# Patient Record
Sex: Female | Born: 1975 | Race: White | Hispanic: No | Marital: Married | Smoking: Never smoker
Health system: Southern US, Community
[De-identification: ages and names within clinical notes are randomized; demographics above are authoritative.]

## PROBLEM LIST (undated history)

## (undated) DIAGNOSIS — J45909 Unspecified asthma, uncomplicated: Secondary | ICD-10-CM

## (undated) DIAGNOSIS — T7840XA Allergy, unspecified, initial encounter: Secondary | ICD-10-CM

## (undated) HISTORY — DX: Allergy, unspecified, initial encounter: T78.40XA

## (undated) HISTORY — DX: Unspecified asthma, uncomplicated: J45.909

---

## 1998-05-07 ENCOUNTER — Emergency Department (HOSPITAL_COMMUNITY): Admission: EM | Admit: 1998-05-07 | Discharge: 1998-05-07 | Payer: Self-pay | Admitting: Emergency Medicine

## 1998-12-12 ENCOUNTER — Other Ambulatory Visit: Admission: RE | Admit: 1998-12-12 | Discharge: 1998-12-12 | Payer: Self-pay | Admitting: Obstetrics and Gynecology

## 2000-01-09 ENCOUNTER — Other Ambulatory Visit: Admission: RE | Admit: 2000-01-09 | Discharge: 2000-01-09 | Payer: Self-pay | Admitting: Obstetrics and Gynecology

## 2001-07-22 ENCOUNTER — Other Ambulatory Visit: Admission: RE | Admit: 2001-07-22 | Discharge: 2001-07-22 | Payer: Self-pay | Admitting: Obstetrics and Gynecology

## 2002-01-27 ENCOUNTER — Ambulatory Visit (HOSPITAL_COMMUNITY): Admission: RE | Admit: 2002-01-27 | Discharge: 2002-01-27 | Payer: Self-pay | Admitting: *Deleted

## 2002-01-27 ENCOUNTER — Encounter: Payer: Self-pay | Admitting: Allergy and Immunology

## 2002-09-25 ENCOUNTER — Other Ambulatory Visit: Admission: RE | Admit: 2002-09-25 | Discharge: 2002-09-25 | Payer: Self-pay | Admitting: Obstetrics and Gynecology

## 2003-09-28 ENCOUNTER — Other Ambulatory Visit: Admission: RE | Admit: 2003-09-28 | Discharge: 2003-09-28 | Payer: Self-pay | Admitting: Obstetrics and Gynecology

## 2004-04-15 ENCOUNTER — Ambulatory Visit: Payer: Self-pay | Admitting: Family Medicine

## 2004-10-31 ENCOUNTER — Ambulatory Visit: Payer: Self-pay | Admitting: Family Medicine

## 2005-01-22 ENCOUNTER — Ambulatory Visit: Payer: Self-pay | Admitting: Internal Medicine

## 2005-02-09 ENCOUNTER — Other Ambulatory Visit: Admission: RE | Admit: 2005-02-09 | Discharge: 2005-02-09 | Payer: Self-pay | Admitting: Obstetrics and Gynecology

## 2005-10-19 ENCOUNTER — Ambulatory Visit: Payer: Self-pay | Admitting: Family Medicine

## 2007-07-26 ENCOUNTER — Emergency Department (HOSPITAL_COMMUNITY): Admission: EM | Admit: 2007-07-26 | Discharge: 2007-07-26 | Payer: Self-pay | Admitting: Emergency Medicine

## 2007-08-15 ENCOUNTER — Emergency Department (HOSPITAL_COMMUNITY): Admission: EM | Admit: 2007-08-15 | Discharge: 2007-08-15 | Payer: Self-pay | Admitting: Emergency Medicine

## 2007-10-16 ENCOUNTER — Emergency Department (HOSPITAL_COMMUNITY): Admission: EM | Admit: 2007-10-16 | Discharge: 2007-10-16 | Payer: Self-pay | Admitting: Family Medicine

## 2007-11-24 ENCOUNTER — Emergency Department (HOSPITAL_COMMUNITY): Admission: EM | Admit: 2007-11-24 | Discharge: 2007-11-24 | Payer: Self-pay | Admitting: Family Medicine

## 2008-01-05 ENCOUNTER — Emergency Department (HOSPITAL_COMMUNITY): Admission: EM | Admit: 2008-01-05 | Discharge: 2008-01-05 | Payer: Self-pay | Admitting: Family Medicine

## 2008-02-22 ENCOUNTER — Emergency Department (HOSPITAL_COMMUNITY): Admission: EM | Admit: 2008-02-22 | Discharge: 2008-02-22 | Payer: Self-pay | Admitting: Family Medicine

## 2008-05-01 ENCOUNTER — Emergency Department (HOSPITAL_COMMUNITY): Admission: EM | Admit: 2008-05-01 | Discharge: 2008-05-01 | Payer: Self-pay | Admitting: Family Medicine

## 2010-03-17 ENCOUNTER — Ambulatory Visit (HOSPITAL_COMMUNITY)
Admission: RE | Admit: 2010-03-17 | Discharge: 2010-03-17 | Payer: Self-pay | Source: Home / Self Care | Admitting: Obstetrics and Gynecology

## 2010-04-22 ENCOUNTER — Encounter (INDEPENDENT_AMBULATORY_CARE_PROVIDER_SITE_OTHER): Payer: Self-pay | Admitting: Obstetrics and Gynecology

## 2010-04-22 ENCOUNTER — Inpatient Hospital Stay (HOSPITAL_COMMUNITY)
Admission: RE | Admit: 2010-04-22 | Discharge: 2010-04-26 | Payer: Self-pay | Source: Home / Self Care | Attending: Obstetrics and Gynecology | Admitting: Obstetrics and Gynecology

## 2010-04-23 ENCOUNTER — Encounter: Payer: Self-pay | Admitting: Cardiology

## 2010-04-26 ENCOUNTER — Encounter
Admission: RE | Admit: 2010-04-26 | Discharge: 2010-05-26 | Payer: Self-pay | Source: Home / Self Care | Attending: Obstetrics and Gynecology | Admitting: Obstetrics and Gynecology

## 2010-05-03 ENCOUNTER — Inpatient Hospital Stay (HOSPITAL_COMMUNITY): Admission: AD | Admit: 2010-05-03 | Payer: Self-pay | Admitting: Obstetrics and Gynecology

## 2010-05-24 ENCOUNTER — Encounter: Payer: Self-pay | Admitting: Obstetrics and Gynecology

## 2010-05-27 ENCOUNTER — Encounter
Admission: RE | Admit: 2010-05-27 | Discharge: 2010-06-03 | Payer: Self-pay | Source: Home / Self Care | Attending: Obstetrics and Gynecology | Admitting: Obstetrics and Gynecology

## 2010-06-27 ENCOUNTER — Encounter (HOSPITAL_COMMUNITY): Payer: Self-pay

## 2010-06-27 ENCOUNTER — Encounter (HOSPITAL_COMMUNITY)
Admission: RE | Admit: 2010-06-27 | Discharge: 2010-06-27 | Disposition: A | Discharge status: Home / Self Care | Payer: Self-pay | Source: Ambulatory Visit | Attending: Obstetrics and Gynecology | Admitting: Obstetrics and Gynecology

## 2010-06-27 ENCOUNTER — Ambulatory Visit (HOSPITAL_COMMUNITY)
Admission: RE | Admit: 2010-06-27 | Discharge: 2010-06-27 | Disposition: A | Discharge status: Home / Self Care | Payer: Self-pay | Source: Ambulatory Visit | Attending: Obstetrics and Gynecology | Admitting: Obstetrics and Gynecology

## 2010-06-27 DIAGNOSIS — O923 Agalactia: Secondary | ICD-10-CM | POA: Insufficient documentation

## 2010-07-14 LAB — CARDIAC PANEL(CRET KIN+CKTOT+MB+TROPI)
CK, MB: 10.2 ng/mL (ref 0.3–4.0)
CK, MB: 10.3 ng/mL (ref 0.3–4.0)
Relative Index: 1.7 (ref 0.0–2.5)
Relative Index: 2.3 (ref 0.0–2.5)
Total CK: 421 U/L — ABNORMAL HIGH (ref 7–177)

## 2010-07-14 LAB — COMPREHENSIVE METABOLIC PANEL
ALT: 16 U/L (ref 0–35)
AST: 28 U/L (ref 0–37)
Alkaline Phosphatase: 152 U/L — ABNORMAL HIGH (ref 39–117)
CO2: 23 mEq/L (ref 19–32)
Calcium: 8.5 mg/dL (ref 8.4–10.5)
Chloride: 106 mEq/L (ref 96–112)
GFR calc Af Amer: >60 mL/min (ref >60)
GFR calc non Af Amer: >60 mL/min (ref >60)
Potassium: 3.8 mEq/L (ref 3.5–5.1)
Sodium: 135 mEq/L (ref 135–145)
Total Bilirubin: 0.2 mg/dL — ABNORMAL LOW (ref 0.3–1.2)

## 2010-07-14 LAB — CBC
HCT: 36.9 % (ref 36.0–46.0)
Hemoglobin: 10.3 g/dL — ABNORMAL LOW (ref 12.0–15.0)
Hemoglobin: 12.6 g/dL (ref 12.0–15.0)
MCHC: 33.7 g/dL (ref 30.0–36.0)
RBC: 4.21 MIL/uL (ref 3.87–5.11)
WBC: 10.5 10*3/uL (ref 4.0–10.5)

## 2010-07-14 LAB — DIFFERENTIAL
Basophils Absolute: 0 10*3/uL (ref 0.0–0.1)
Basophils Relative: 0 % (ref 0–1)
Monocytes Relative: 10 % (ref 3–12)
Neutro Abs: 8 10*3/uL — ABNORMAL HIGH (ref 1.7–7.7)
Neutrophils Relative %: 76 % (ref 43–77)

## 2010-07-14 LAB — GLUCOSE, CAPILLARY

## 2010-07-15 LAB — SURGICAL PCR SCREEN: MRSA, PCR: NEGATIVE

## 2010-07-15 LAB — CBC
HCT: 38.4 % (ref 36.0–46.0)
MCHC: 33.6 g/dL (ref 30.0–36.0)
MCV: 93.7 fL (ref 78.0–100.0)
RDW: 13.4 % (ref 11.5–15.5)

## 2012-06-10 ENCOUNTER — Emergency Department (HOSPITAL_COMMUNITY)
Admission: EM | Admit: 2012-06-10 | Discharge: 2012-06-10 | Disposition: A | Discharge status: Home / Self Care | Payer: 59 | Source: Home / Self Care | Attending: Family Medicine | Admitting: Family Medicine

## 2012-06-10 ENCOUNTER — Encounter (HOSPITAL_COMMUNITY): Payer: Self-pay

## 2012-06-10 DIAGNOSIS — J329 Chronic sinusitis, unspecified: Secondary | ICD-10-CM

## 2012-06-10 DIAGNOSIS — J40 Bronchitis, not specified as acute or chronic: Secondary | ICD-10-CM

## 2012-06-10 MED ORDER — ALBUTEROL SULFATE HFA 108 (90 BASE) MCG/ACT IN AERS: 1.0000 | INHALATION_SPRAY | Freq: Four times a day (QID) | RESPIRATORY_TRACT | Status: DC | PRN

## 2012-06-10 MED ORDER — CETIRIZINE-PSEUDOEPHEDRINE ER 5-120 MG PO TB12: 1.0000 | ORAL_TABLET | Freq: Two times a day (BID) | ORAL | Status: DC

## 2012-06-10 MED ORDER — AZITHROMYCIN 250 MG PO TABS: ORAL_TABLET | ORAL | Status: DC

## 2012-06-10 MED ORDER — PREDNISONE 20 MG PO TABS: ORAL_TABLET | ORAL | Status: DC

## 2012-06-10 MED ORDER — HYDROCODONE-ACETAMINOPHEN 7.5-325 MG/15ML PO SOLN: 15.0000 mL | Freq: Four times a day (QID) | ORAL | Status: DC | PRN

## 2012-06-10 MED ORDER — BENZONATATE 100 MG PO CAPS: 100.0000 mg | ORAL_CAPSULE | Freq: Three times a day (TID) | ORAL | Status: DC

## 2012-06-10 NOTE — ED Notes (Signed)
I THINK I HAVE A SINUS INFECTION

## 2012-06-11 NOTE — ED Provider Notes (Signed)
History     CSN: [id number]  Arrival date & time 06/10/12  1553   First MD Initiated Contact with Patient 06/10/12 1600      Chief Complaint  Patient presents with  . Facial Pain    (Consider location/radiation/quality/duration/timing/severity/associated sxs/prior treatment) HPI Comments: 37 y/o non smoker female here c/o nasal congestion post nasal drip and rhinorrhea for more than 2 weeks. Symptoms associated with throat irritation and bilateral ear pain. Has had cough spells with wheezing at night time associated with clear sputum. Denies shortness of breath or chest pain. Denies fever or chills. Although temp here is 100.1. Appetite stable.    History reviewed. No pertinent past medical history.  History reviewed. No pertinent past surgical history.  History reviewed. No pertinent family history.  History  Substance Use Topics  . Smoking status: Not on file  . Smokeless tobacco: Not on file  . Alcohol Use: Not on file    OB History   Grav Para Term Preterm Abortions TAB SAB Ect Mult Living                  Review of Systems  Constitutional: Negative for fever, chills and appetite change.  HENT: Positive for ear pain, congestion, sore throat, rhinorrhea, postnasal drip and sinus pressure.   Eyes: Negative for discharge.  Respiratory: Positive for cough and wheezing. Negative for shortness of breath.   Cardiovascular: Negative for chest pain.  Gastrointestinal: Negative for nausea, vomiting, abdominal pain and diarrhea.  Skin: Negative for rash.  All other systems reviewed and are negative.    Allergies  Amoxicillin  Home Medications   Current Outpatient Rx  Name  Route  Sig  Dispense  Refill  . albuterol (PROVENTIL HFA;VENTOLIN HFA) 108 (90 BASE) MCG/ACT inhaler   Inhalation   Inhale 1-2 puffs into the lungs every 6 (six) hours as needed for wheezing.   1 Inhaler   0   . azithromycin (ZITHROMAX) 250 MG tablet      2 tabs by mouth on day one then one  tab daily for 4 more days.   6 tablet   0   . benzonatate (TESSALON) 100 MG capsule   Oral   Take 1 capsule (100 mg total) by mouth every 8 (eight) hours.   21 capsule   0   . cetirizine-pseudoephedrine (ZYRTEC-D) 5-120 MG per tablet   Oral   Take 1 tablet by mouth 2 (two) times daily.   30 tablet   0   . HYDROcodone-acetaminophen (HYCET) 7.5-325 mg/15 ml solution   Oral   Take 15 mLs by mouth 4 (four) times daily as needed for pain or cough.   120 mL   0   . predniSONE (DELTASONE) 20 MG tablet      2 tabs by mouth daily for 5 days   10 tablet   0     BP 118/75  Pulse 84  Temp(Src) 100.1 F (37.8 C) (Oral)  Resp 18  SpO2 100%  Physical Exam  Vitals reviewed. Constitutional: She is oriented to person, place, and time. She appears well-developed and well-nourished. No distress.  HENT:  Head: Normocephalic and atraumatic.  Nasal Congestion with erythema and swelling of nasal turbinates, clear rhinorrhea. Significant pharyngeal erythema no exudates. No uvula deviation. No trismus. TM's with increased vascular markings and some dullness bilaterally no swelling or bulging  Eyes: Conjunctivae and EOM are normal. Pupils are equal, round, and reactive to light. Right eye exhibits no discharge. Left eye  exhibits no discharge.  Neck: Neck supple.  Cardiovascular: Normal rate, regular rhythm and normal heart sounds.   Pulmonary/Chest: Breath sounds normal. No respiratory distress. She has no wheezes. She has no rales. She exhibits no tenderness.  Persistent bronchitic cough  Lymphadenopathy:    She has no cervical adenopathy.  Neurological: She is alert and oriented to person, place, and time.  Skin: She is not diaphoretic.    ED Course  Procedures (including critical care time)  Labs Reviewed  POCT RAPID STREP A ([facility] ONLY)   No results found.   1. Sinusitis   2. Bronchitis       MDM   Negative rapid strep test. Treated with prednisone, albuterol,  tessalon perls,  Hydrocodone/acetaminophen, cetirizine/pseudoephedrine and azithromycin.  Supportive care and red flags that should prompt her return to medical attention discussed with patient and provided in writing.        Sharin Grave, MD 06/11/12 1023

## 2013-11-09 ENCOUNTER — Ambulatory Visit (INDEPENDENT_AMBULATORY_CARE_PROVIDER_SITE_OTHER): Payer: 59 | Admitting: Physician Assistant

## 2013-11-09 VITALS — BP 120/80 | HR 82 | Temp 98.8°F | Resp 18 | Ht 64.5 in | Wt 160.0 lb

## 2013-11-09 DIAGNOSIS — R509 Fever, unspecified: Secondary | ICD-10-CM

## 2013-11-09 DIAGNOSIS — R52 Pain, unspecified: Secondary | ICD-10-CM

## 2013-11-09 DIAGNOSIS — W57XXXA Bitten or stung by nonvenomous insect and other nonvenomous arthropods, initial encounter: Secondary | ICD-10-CM

## 2013-11-09 MED ORDER — DOXYCYCLINE HYCLATE 100 MG PO CAPS: 100.0000 mg | ORAL_CAPSULE | Freq: Two times a day (BID) | ORAL | Status: DC

## 2013-11-09 NOTE — Progress Notes (Signed)
Subjective:    Patient ID: Mallory SarasKathryn C Williams, female    DOB: 1975/08/28, 38 y.o.   MRN: [id number]  HPI 38 year old female presents for evaluation of of 4 day history of fever, chills, body aches, and headache. States symptoms have been persistent and have not improved.  Temp max is 101.8. Admits to a tick bite on July 4th on her right upper arm.  Is sure that it was not on her for >24 hours. She does have a dog that is likely the source of the tick.  States she subsequently cleaned her entire house and now has chest/back soreness, but no cough, SOB, wheezing, dizziness, N/V, or abdominal pain. States she has been taking tylenol and motrin which doe help with the headache and body aches.   Her husband did notice a place on her back this morning that he thought looked like a bruise.  It is on the left side of her back. No pain or pruritis noted.  Denies cough, sore throat, otalgia, or other URI symptoms. Headache is located across her forehead.   Patient is otherwise healthy with no other concerns today Has 993 1/36 year old triplets at home.    Review of Systems  Constitutional: Positive for fever and chills.  HENT: Positive for sinus pressure (frontal headache). Negative for congestion, postnasal drip, rhinorrhea and sore throat.   Respiratory: Negative for cough, chest tightness, shortness of breath and wheezing.   Gastrointestinal: Negative for nausea, vomiting and abdominal pain.  Skin: Positive for rash.  Neurological: Positive for headaches. Negative for dizziness.       Objective:   Physical Exam  Constitutional: She is oriented to person, place, and time. She appears well-developed and well-nourished.  HENT:  Head: Normocephalic and atraumatic.  Right Ear: Hearing, tympanic membrane, external ear and ear canal normal.  Left Ear: Hearing, tympanic membrane, external ear and ear canal normal.  Mouth/Throat: Uvula is midline, oropharynx is clear and moist and mucous membranes are normal.  No oropharyngeal exudate.  Eyes: Conjunctivae and EOM are normal. Pupils are equal, round, and reactive to light.  Neck: Normal range of motion. Neck supple.  Cardiovascular: Normal rate, regular rhythm and normal heart sounds.   Pulmonary/Chest: Effort normal.  Abdominal: Soft. Bowel sounds are normal. There is no tenderness. There is no rebound and no guarding.  Lymphadenopathy:    She has no cervical adenopathy.  Neurological: She is alert and oriented to person, place, and time.  Skin:     Noted area has a group of erythematous papules. Not TTP. No induration, drainage, vesicles, blisters, or warmth noted.   Psychiatric: She has a normal mood and affect. Her behavior is normal. Judgment and thought content normal.          Assessment & Plan:  Fever, unspecified - Plan: CBC with Differential, [place] spotted fvr ab, IgM-blood, Comprehensive metabolic panel, doxycycline (VIBRAMYCIN) 100 MG capsule  Body aches - Plan: CBC with Differential, [place] spotted fvr ab, IgM-blood, Comprehensive metabolic panel, doxycycline (VIBRAMYCIN) 100 MG capsule  Tick bite - Plan: CBC with Differential, [place] spotted fvr ab, IgM-blood, Comprehensive metabolic panel, doxycycline (VIBRAMYCIN) 100 MG capsule  Will cover for possible tick disease based on symptoms. RMSF titer pending along with CBC and CMET. Start doxycycline 100 mg bid x 14 days - cautioned against photosensitivity.  Watchful waiting on rash - ? Shingles - will send rx for valtrex if blisters/vesicles, pain, or spreading anteriorly.  RTC precautions discussed.  F/u if symptoms worsening or fail to improve. Discussed need to trend RMSF titer if negative and sx's fail to improve. Continue Motrin or Tylenol for headache, fever, and body aches.

## 2013-11-10 ENCOUNTER — Telehealth: Payer: Self-pay

## 2013-11-10 LAB — COMPREHENSIVE METABOLIC PANEL
ALT: 11 U/L (ref 0–35)
AST: 12 U/L (ref 0–37)
Albumin: 4.8 g/dL (ref 3.5–5.2)
Alkaline Phosphatase: 51 U/L (ref 39–117)
BUN: 13 mg/dL (ref 6–23)
CO2: 24 mEq/L (ref 19–32)
Calcium: 9.7 mg/dL (ref 8.4–10.5)
Chloride: 106 mEq/L (ref 96–112)
Creat: 0.73 mg/dL (ref 0.50–1.10)
Glucose, Bld: 95 mg/dL (ref 70–99)
Potassium: 4.1 mEq/L (ref 3.5–5.3)
Sodium: 138 mEq/L (ref 135–145)
Total Bilirubin: 0.4 mg/dL (ref 0.2–1.2)
Total Protein: 7.1 g/dL (ref 6.0–8.3)

## 2013-11-10 LAB — CBC WITH DIFFERENTIAL/PLATELET
Basophils Absolute: 0 10*3/uL (ref 0.0–0.1)
Basophils Relative: 0 % (ref 0–1)
Eosinophils Absolute: 0 10*3/uL (ref 0.0–0.7)
Eosinophils Relative: 0 % (ref 0–5)
HCT: 40.3 % (ref 36.0–46.0)
Hemoglobin: 13.8 g/dL (ref 12.0–15.0)
Lymphocytes Relative: 20 % (ref 12–46)
Lymphs Abs: 1.4 10*3/uL (ref 0.7–4.0)
MCH: 29.2 pg (ref 26.0–34.0)
MCHC: 34.2 g/dL (ref 30.0–36.0)
MCV: 85.2 fL (ref 78.0–100.0)
Monocytes Absolute: 0.8 10*3/uL (ref 0.1–1.0)
Monocytes Relative: 11 % (ref 3–12)
Neutro Abs: 4.9 10*3/uL (ref 1.7–7.7)
Neutrophils Relative %: 69 % (ref 43–77)
Platelets: 311 10*3/uL (ref 150–400)
RBC: 4.73 MIL/uL (ref 3.87–5.11)
RDW: 12.9 % (ref 11.5–15.5)
WBC: 7.1 10*3/uL (ref 4.0–10.5)

## 2013-11-10 LAB — ROCKY MTN SPOTTED FVR AB, IGM-BLOOD: ROCKY MTN SPOTTED FEVER, IGM: 0.3 IV

## 2013-11-10 MED ORDER — VALACYCLOVIR HCL 1 G PO TABS: 1000.0000 mg | ORAL_TABLET | Freq: Two times a day (BID) | ORAL | Status: DC

## 2013-11-10 NOTE — Telephone Encounter (Signed)
Patient states she saw Herbert SetaHeather yesterday 7/9 for a rash and if her symptoms got worse she was told Herbert SetaHeather would put in a prescription for her. Please advise at [phone]. [place] would [place] the pharmacy in [place] [facility]

## 2013-11-10 NOTE — Telephone Encounter (Signed)
Spoke with pt. She says the initial rash is very tender to touch and it is now spreading under her breast and on her chest. It has not crossed the midline. Spoke with Avery Dennison[facility]ather. Ok to send in Valtrex. Sent

## 2013-11-10 NOTE — Telephone Encounter (Signed)
Left message on machine to call back  

## 2014-05-15 ENCOUNTER — Ambulatory Visit (INDEPENDENT_AMBULATORY_CARE_PROVIDER_SITE_OTHER): Payer: 59 | Admitting: Physician Assistant

## 2014-05-15 VITALS — BP 120/72 | HR 88 | Temp 98.6°F | Resp 18 | Ht 65.0 in | Wt 161.0 lb

## 2014-05-15 DIAGNOSIS — B9789 Other viral agents as the cause of diseases classified elsewhere: Secondary | ICD-10-CM

## 2014-05-15 DIAGNOSIS — J45990 Exercise induced bronchospasm: Secondary | ICD-10-CM

## 2014-05-15 DIAGNOSIS — J069 Acute upper respiratory infection, unspecified: Secondary | ICD-10-CM

## 2014-05-15 DIAGNOSIS — J029 Acute pharyngitis, unspecified: Secondary | ICD-10-CM

## 2014-05-15 LAB — POCT RAPID STREP A (OFFICE): RAPID STREP A SCREEN: NEGATIVE

## 2014-05-15 MED ORDER — BENZONATATE 100 MG PO CAPS: 100.0000 mg | ORAL_CAPSULE | Freq: Three times a day (TID) | ORAL | Status: DC | PRN

## 2014-05-15 MED ORDER — ALBUTEROL SULFATE HFA 108 (90 BASE) MCG/ACT IN AERS: 1.0000 | INHALATION_SPRAY | Freq: Four times a day (QID) | RESPIRATORY_TRACT | Status: DC | PRN

## 2014-05-15 MED ORDER — HYDROCODONE-HOMATROPINE 5-1.5 MG/5ML PO SYRP: 5.0000 mL | ORAL_SOLUTION | Freq: Three times a day (TID) | ORAL | Status: DC | PRN

## 2014-05-15 MED ORDER — LORATADINE-PSEUDOEPHEDRINE ER 5-120 MG PO TB12: 1.0000 | ORAL_TABLET | Freq: Two times a day (BID) | ORAL | Status: DC

## 2014-05-15 NOTE — Progress Notes (Signed)
IDENTIFYING INFORMATION  Matthew SarasKathryn C Wolden / DOB: 08-07-1975 / MRN: [id number]  The patient  does not have a problem list on file.  SUBJECTIVE  CC: Cough; Fever; Nasal Congestion; Chills; and Headache   HPI: Ms. Mallory Williams is a 39 y.o. y.o. female presenting for cold like symptoms that started 5 days ago.  She reports this started with coughing.  4 days ago she notes having sinus congestion and pressure, and reports fevers for the last three nights (Tmax:101.4).  She has tried Nyquil, Dayquil, Robitusin, Cough Drops, and Tylenol, all which gave her mild relief.    She has a history of exercise induced asthma.  Her cough is worse at night.     She  has no past medical history on file.    Shecurrently has no medications in their medication list.  Ms. Mallory Williams is allergic to amoxicillin. She  reports that she has never smoked. She does not have any smokeless tobacco history on file. She reports that she drinks alcohol. She reports that she does not use illicit drugs. She  has no sexual activity history on file.  The patient  has past surgical history that includes Cesarean section.  Her family history is not on file.  Review of Systems  Constitutional: Positive for fever, chills and diaphoresis. Negative for weight loss.  Respiratory: Positive for cough, sputum production and shortness of breath. Negative for hemoptysis and wheezing.   Cardiovascular: Negative for chest pain, palpitations, orthopnea and leg swelling.  Gastrointestinal: Negative for nausea, vomiting, abdominal pain, diarrhea and constipation.  Genitourinary: Negative.   Skin: Negative.     OBJECTIVE  Blood pressure 120/72, pulse 88, temperature 98.6 F (37 C), temperature source Oral, resp. rate 18, height 5\' 5"  (1.651 m), weight 161 lb (73.029 kg), SpO2 97 %. The patient's body mass index is 26.79 kg/(m^2).  Physical Exam  Constitutional: She is oriented to person, place, and time. She appears well-developed and  well-nourished. No distress.  HENT:  Right Ear: Hearing and tympanic membrane normal.  Left Ear: Hearing and tympanic membrane normal.  Nose: Mucosal edema present. Right sinus exhibits no maxillary sinus tenderness and no frontal sinus tenderness. Left sinus exhibits no maxillary sinus tenderness and no frontal sinus tenderness.  Mouth/Throat: Uvula is midline and mucous membranes are normal. No uvula swelling. Posterior oropharyngeal erythema present. No oropharyngeal exudate, posterior oropharyngeal edema or tonsillar abscesses.  Cardiovascular: Normal rate, regular rhythm and normal heart sounds.  Exam reveals no gallop and no friction rub.   No murmur heard. Respiratory: Effort normal and breath sounds normal. She has no wheezes.  GI: Soft. Bowel sounds are normal. She exhibits no distension. There is no tenderness.  Neurological: She is alert and oriented to person, place, and time. She has normal reflexes.  Skin: Skin is warm and dry. She is not diaphoretic.  Psychiatric: She has a normal mood and affect. Her behavior is normal. Judgment and thought content normal.    Results for orders placed or performed in visit on 05/15/14 (from the past 24 hour(s))  POCT rapid strep A     Status: None   Collection Time: 05/15/14  1:39 PM  Result Value Ref Range   Rapid Strep A Screen Negative Negative    ASSESSMENT & PLAN  [name] was seen today for cough, fever, nasal congestion, chills and headache.  Diagnoses and associated orders for this visit:  Sore throat - POCT rapid strep A - Culture, Group A Strep   Viral  URI with cough - HYDROcodone-homatropine (HYCODAN) 5-1.5 MG/5ML syrup; Take 5 mLs by mouth every 8 (eight) hours as needed for cough. - benzonatate (TESSALON) 100 MG capsule; Take 1-2 capsules (100-200 mg total) by mouth 3 (three) times daily as needed for cough. - loratadine-pseudoephedrine (CLARITIN-D 12 HOUR) 5-120 MG per tablet; Take 1 tablet by mouth 2 (two) times  daily.  Asthma, exercise induced: Historical diagnosis - albuterol (PROVENTIL HFA;VENTOLIN HFA) 108 (90 BASE) MCG/ACT inhaler; Inhale 1-2 puffs into the lungs every 6 (six) hours as needed for wheezing.     The patient was instructed to to call or comeback to clinic as needed, or should symptoms warrant.  Deliah Boston, MHS, PA-C [facility] 05/15/2014 1:52 PM

## 2014-05-17 LAB — CULTURE, GROUP A STREP: Organism ID, Bacteria: NORMAL

## 2014-06-03 ENCOUNTER — Ambulatory Visit (INDEPENDENT_AMBULATORY_CARE_PROVIDER_SITE_OTHER): Payer: 59 | Admitting: Internal Medicine

## 2014-06-03 VITALS — BP 114/70 | HR 94 | Temp 98.6°F | Resp 16 | Ht 64.5 in | Wt 159.5 lb

## 2014-06-03 DIAGNOSIS — J069 Acute upper respiratory infection, unspecified: Secondary | ICD-10-CM

## 2014-06-03 DIAGNOSIS — J0100 Acute maxillary sinusitis, unspecified: Secondary | ICD-10-CM

## 2014-06-03 DIAGNOSIS — B9789 Other viral agents as the cause of diseases classified elsewhere: Secondary | ICD-10-CM

## 2014-06-03 MED ORDER — CEFDINIR 300 MG PO CAPS: 300.0000 mg | ORAL_CAPSULE | Freq: Two times a day (BID) | ORAL | Status: DC

## 2014-06-03 MED ORDER — HYDROCODONE-HOMATROPINE 5-1.5 MG/5ML PO SYRP: 5.0000 mL | ORAL_SOLUTION | Freq: Four times a day (QID) | ORAL | Status: DC | PRN

## 2014-06-03 NOTE — Progress Notes (Signed)
   Subjective:  This chart was scribed for Mallory Siaobert Ammy Lienhard, MD by Haywood PaoNadim Abu Williams, ED Scribe at [facility].The patient was seen in exam room 02 and the patient's care was started at 1:39 PM.   Patient ID: Mallory Williams, female    DOB: 1976-01-23, 39 y.o.   MRN: [id number] Chief Complaint  Patient presents with  . Follow-up    from 05/15/2014 visit-Symptoms have not gotten any better  . Headache  . Cough  . Nasal Congestion  . Fatigue   HPI  HPI Comments: Mallory Williams is a 39 y.o. female who [facility] complaining of nasal congestion which has began after her last visit on 05/15/2014. Pt reports that her symptoms from her last visit have improved but shortly after she developed a sinus infection. She has HA, cough and rhinorrhea as associated symptoms. Laying down and activity worsens her symptoms. She has used her inhaler which has provided relief. Pt has also used a hydrocodone syrup which has also provided relief.   Pt works [employer]   Review of Systems  HENT: Positive for congestion and rhinorrhea.   Respiratory: Positive for cough.   Neurological: Positive for headaches.  Psychiatric/Behavioral: Positive for sleep disturbance.      Objective:  BP 114/70 mmHg  Pulse 94  Temp(Src) 98.6 F (37 C) (Oral)  Resp 16  Ht 5' 4.5" (1.638 m)  Wt 159 lb 8 oz (72.349 kg)  BMI 26.97 kg/m2  SpO2 98%  Physical Exam  Constitutional: She is oriented to person, place, and time. She appears well-developed and well-nourished. No distress.  HENT:  Head: Normocephalic and atraumatic.  Right Ear: External ear normal.  Left Ear: External ear normal.  Mouth/Throat: Oropharynx is clear and moist.  Swollen turbinates with purulent discharge.  Eyes: Conjunctivae are normal. Pupils are equal, round, and reactive to light.  Neck: Normal range of motion.  Cardiovascular: Normal rate and regular rhythm.   Pulmonary/Chest: Effort normal and breath sounds normal. No  respiratory distress. She has no wheezes.  Musculoskeletal: Normal range of motion.  Lymphadenopathy:    She has no cervical adenopathy.  Neurological: She is alert and oriented to person, place, and time.  Skin: Skin is warm and dry.  Psychiatric: She has a normal mood and affect. Her behavior is normal.  Nursing note and vitals reviewed.      Assessment & Plan:  Acute maxillary sinusitis, recurrence not specified  Cough - Plan: HYDROcodone-homatropine (HYCODAN) 5-1.5 MG/5ML syrup  Meds ordered this encounter  Medications  . cefdinir (OMNICEF) 300 MG capsule    Sig: Take 1 capsule (300 mg total) by mouth 2 (two) times daily.    Dispense:  20 capsule    Refill:  0  . HYDROcodone-homatropine (HYCODAN) 5-1.5 MG/5ML syrup    Sig: Take 5 mLs by mouth every 6 (six) hours as needed for cough.    Dispense:  120 mL    Refill:  0   F/u 10d if not well    I have completed the patient encounter in its entirety as documented by the scribe, with editing by me where necessary. Mallory Williams, M.D.

## 2015-02-12 ENCOUNTER — Ambulatory Visit (INDEPENDENT_AMBULATORY_CARE_PROVIDER_SITE_OTHER): Payer: 59

## 2015-02-12 ENCOUNTER — Ambulatory Visit (INDEPENDENT_AMBULATORY_CARE_PROVIDER_SITE_OTHER): Payer: 59 | Admitting: Emergency Medicine

## 2015-02-12 VITALS — BP 108/78 | HR 77 | Temp 98.4°F | Resp 16 | Ht 64.5 in | Wt 162.0 lb

## 2015-02-12 DIAGNOSIS — R05 Cough: Secondary | ICD-10-CM | POA: Diagnosis not present

## 2015-02-12 DIAGNOSIS — J988 Other specified respiratory disorders: Secondary | ICD-10-CM | POA: Diagnosis not present

## 2015-02-12 DIAGNOSIS — J018 Other acute sinusitis: Secondary | ICD-10-CM | POA: Diagnosis not present

## 2015-02-12 DIAGNOSIS — R058 Other specified cough: Secondary | ICD-10-CM

## 2015-02-12 MED ORDER — LEVOFLOXACIN 500 MG PO TABS: 500.0000 mg | ORAL_TABLET | Freq: Every day | ORAL | Status: AC

## 2015-02-12 MED ORDER — GUAIFENESIN ER 1200 MG PO TB12: 1.0000 | ORAL_TABLET | Freq: Two times a day (BID) | ORAL | Status: DC | PRN

## 2015-02-12 NOTE — Progress Notes (Signed)
  Medical screening examination/treatment/procedure(s) were performed by non-physician practitioner and as supervising physician I was immediately available for consultation/collaboration.     

## 2015-02-12 NOTE — Patient Instructions (Addendum)
Please use the nasal saline irrigation over the counter. Please take the medications as prescribed.    Sinusitis, Adult Sinusitis is redness, soreness, and inflammation of the paranasal sinuses. Paranasal sinuses are air pockets within the bones of your face. They are located beneath your eyes, in the middle of your forehead, and above your eyes. In healthy paranasal sinuses, mucus is able to drain out, and air is able to circulate through them by way of your nose. However, when your paranasal sinuses are inflamed, mucus and air can become trapped. This can allow bacteria and other germs to grow and cause infection. Sinusitis can develop quickly and last only a short time (acute) or continue over a long period (chronic). Sinusitis that lasts for more than 12 weeks is considered chronic. CAUSES Causes of sinusitis include:  Allergies.  Structural abnormalities, such as displacement of the cartilage that separates your nostrils (deviated septum), which can decrease the air flow through your nose and sinuses and affect sinus drainage.  Functional abnormalities, such as when the small hairs (cilia) that line your sinuses and help remove mucus do not work properly or are not present. SIGNS AND SYMPTOMS Symptoms of acute and chronic sinusitis are the same. The primary symptoms are pain and pressure around the affected sinuses. Other symptoms include:  Upper toothache.  Earache.  Headache.  Bad breath.  Decreased sense of smell and taste.  A cough, which worsens when you are lying flat.  Fatigue.  Fever.  Thick drainage from your nose, which often is green and may contain pus (purulent).  Swelling and warmth over the affected sinuses. DIAGNOSIS Your health care provider will perform a physical exam. During your exam, your health care provider may perform any of the following to help determine if you have acute sinusitis or chronic sinusitis:  Look in your nose for signs of abnormal  growths in your nostrils (nasal polyps).  Tap over the affected sinus to check for signs of infection.  View the inside of your sinuses using an imaging device that has a light attached (endoscope). If your health care provider suspects that you have chronic sinusitis, one or more of the following tests may be recommended:  Allergy tests.  Nasal culture. A sample of mucus is taken from your nose, sent to a lab, and screened for bacteria.  Nasal cytology. A sample of mucus is taken from your nose and examined by your health care provider to determine if your sinusitis is related to an allergy. TREATMENT Most cases of acute sinusitis are related to a viral infection and will resolve on their own within 10 days. Sometimes, medicines are prescribed to help relieve symptoms of both acute and chronic sinusitis. These may include pain medicines, decongestants, nasal steroid sprays, or saline sprays. However, for sinusitis related to a bacterial infection, your health care provider will prescribe antibiotic medicines. These are medicines that will help kill the bacteria causing the infection. Rarely, sinusitis is caused by a fungal infection. In these cases, your health care provider will prescribe antifungal medicine. For some cases of chronic sinusitis, surgery is needed. Generally, these are cases in which sinusitis recurs more than 3 times per year, despite other treatments. HOME CARE INSTRUCTIONS  Drink plenty of water. Water helps thin the mucus so your sinuses can drain more easily.  Use a humidifier.  Inhale steam 3-4 times a day (for example, sit in the bathroom with the shower running).  Apply a warm, moist washcloth to your face 3-4  times a day, or as directed by your health care provider.  Use saline nasal sprays to help moisten and clean your sinuses.  Take medicines only as directed by your health care provider.  If you were prescribed either an antibiotic or antifungal medicine,  finish it all even if you start to feel better. SEEK IMMEDIATE MEDICAL CARE IF:  You have increasing pain or severe headaches.  You have nausea, vomiting, or drowsiness.  You have swelling around your face.  You have vision problems.  You have a stiff neck.  You have difficulty breathing.   This information is not intended to replace advice given to you by your health care provider. Make sure you discuss any questions you have with your health care provider.   Document Released: 04/20/2005 Document Revised: 05/11/2014 Document Reviewed: 05/05/2011 Elsevier Interactive Patient Education [employer].

## 2015-02-12 NOTE — Progress Notes (Signed)
[facility] [address] [place] [address] [phone]  Date:  02/12/2015   Name:  Mallory Williams   DOB:  [birth year]   MRN:  [id number]  PCP:  No PCP Per Patient    History of Present Illness:  Mallory Williams is a 39 y.o. female patient who presents to [facility] for chief complaint of fever, body aches, chills and fever that started about 1 week ago.  Fever resolved within 2 days, but she continues to have nasal pressure along her forehead and near ears.  There is thick mucus.  There is sore throat and post-nasal drip  There is no dizziness, productive cough of grey greenish sputum.  There is dyspnea with coughing fits.  She has used tylenol cold and flu, and day/ny-quil.  Sick contacts at home with bacterial respiratory infection, ear infection, and bronchitis.  She also works as a Adult nurse. She is a non-smoker.    There are no active problems to display for this patient.   No past medical history on file.  Past Surgical History  Procedure Laterality Date  . Cesarean section      Social History  Substance Use Topics  . Smoking status: Never Smoker   . Smokeless tobacco: Never Used  . Alcohol Use: 0.0 oz/week    0 Standard drinks or equivalent per week    Family History  Problem Relation Age of Onset  . Breast cancer Maternal Grandmother   . Stroke Maternal Grandmother   . Heart attack Maternal Grandfather     Allergies  Allergen Reactions  . Amoxicillin     Medication list has been reviewed and updated.  Current Outpatient Prescriptions on File Prior to Visit  Medication Sig Dispense Refill  . albuterol (PROVENTIL HFA;VENTOLIN HFA) 108 (90 BASE) MCG/ACT inhaler Inhale 1-2 puffs into the lungs every 6 (six) hours as needed for wheezing. (Patient not taking: Reported on 02/12/2015) 1 Inhaler 0   No current facility-administered medications on file prior to visit.    ROS ROS otherwise unremarkable unless listed above.    Physical  Examination: BP 108/78 mmHg  Pulse 77  Temp(Src) 98.4 F (36.9 C) (Oral)  Resp 16  Ht 5' 4.5" (1.638 m)  Wt 162 lb (73.483 kg)  BMI 27.39 kg/m2  SpO2 98% Ideal Body Weight: Weight in (lb) to have BMI = 25: 147.6  Physical Exam  Constitutional: She is oriented to person, place, and time. She appears well-developed and well-nourished. No distress.  HENT:  Head: Normocephalic and atraumatic.  Right Ear: Tympanic membrane, external ear and ear canal normal.  Left Ear: Tympanic membrane, external ear and ear canal normal.  Nose: Mucosal edema and rhinorrhea (thickened white mucus) present. Right sinus exhibits no maxillary sinus tenderness and no frontal sinus tenderness. Left sinus exhibits no maxillary sinus tenderness and no frontal sinus tenderness.  Mouth/Throat: Posterior oropharyngeal edema and posterior oropharyngeal erythema present. No oropharyngeal exudate.  Eyes: Conjunctivae and EOM are normal. Pupils are equal, round, and reactive to light.  Cardiovascular: Normal rate.   Pulmonary/Chest: Effort normal. No respiratory distress. She has no decreased breath sounds. She has no wheezes. She has no rhonchi.  Neurological: She is alert and oriented to person, place, and time.  Skin: She is not diaphoretic.  Psychiatric: She has a normal mood and affect. Her behavior is normal.   CXR [facility] reading (PRIMARY) by  Dr. Dareen Piano: Negative  Assessment and Plan: Mallory Williams is a 39  y.o. female who is here today for fever, chills, sore throat, nasal pressure and cough for 1 week that is progressively worsening. -treat for bacterial infection sinusitis at this time.  Starting levofloxacin  for 5 days qd.   -advised nasal saline irrigation.   Other subacute sinusitis - Plan: Guaifenesin (MUCINEX MAXIMUM STRENGTH) 1200 MG TB12, levofloxacin (LEVAQUIN) 500 MG tablet  Productive cough - Plan: DG Chest 2 View  Respiratory infection  Trena Platt, PA-C [facility] 02/12/2015 9:04 AM

## 2015-04-29 ENCOUNTER — Ambulatory Visit (INDEPENDENT_AMBULATORY_CARE_PROVIDER_SITE_OTHER): Payer: 59 | Admitting: Physician Assistant

## 2015-04-29 VITALS — BP 122/76 | HR 73 | Temp 98.1°F | Resp 16 | Ht 64.5 in | Wt 167.0 lb

## 2015-04-29 DIAGNOSIS — J45990 Exercise induced bronchospasm: Secondary | ICD-10-CM | POA: Diagnosis not present

## 2015-04-29 DIAGNOSIS — K148 Other diseases of tongue: Secondary | ICD-10-CM

## 2015-04-29 DIAGNOSIS — J0100 Acute maxillary sinusitis, unspecified: Secondary | ICD-10-CM

## 2015-04-29 DIAGNOSIS — J209 Acute bronchitis, unspecified: Secondary | ICD-10-CM | POA: Diagnosis not present

## 2015-04-29 MED ORDER — DOXYCYCLINE HYCLATE 100 MG PO CAPS: 100.0000 mg | ORAL_CAPSULE | Freq: Two times a day (BID) | ORAL | Status: AC

## 2015-04-29 MED ORDER — HYDROCOD POLST-CPM POLST ER 10-8 MG/5ML PO SUER: 5.0000 mL | Freq: Two times a day (BID) | ORAL | Status: DC | PRN

## 2015-04-29 MED ORDER — IPRATROPIUM BROMIDE 0.03 % NA SOLN: 2.0000 | Freq: Two times a day (BID) | NASAL | Status: DC

## 2015-04-29 MED ORDER — ALBUTEROL SULFATE HFA 108 (90 BASE) MCG/ACT IN AERS: 1.0000 | INHALATION_SPRAY | Freq: Four times a day (QID) | RESPIRATORY_TRACT | Status: DC | PRN

## 2015-04-29 NOTE — Patient Instructions (Addendum)
Doxy twice a day for 7 days. Drink plenty of water (64 oz/day) and get plenty of rest. If you have been prescribed a cough syrup, do not drive or operate heavy machinery while using this medication. If you have been prescribed a nasal spray, use twice a day. Continue mucinex twice a day. Schedule your albuterol inhaler every four hours for the next 48 hours and then only as needed. If your symptoms are not improving in 1 week, return to clinic.

## 2015-04-29 NOTE — Progress Notes (Signed)
[facility] [address], [place] [id number] [phone]  Date:  04/29/2015   Name:  Mallory SarasKathryn C Sattar   DOB:  Sep 30, 1975   MRN:  [id number]  PCP:  No PCP Per Patient    Chief Complaint: Nasal Congestion; Cough; Chest Pain; and Medication Refill   History of Present Illness:  This is a 39 y.o. female with PMH allergic rhinitis and exercise-induced asthma who is presenting with cough x 2 weeks. Sometimes productive, sometimes dry. Having problems with wheezing, worse at night. States when she wakes in the morning, takes 15 minutes to stop coughing. Has used albuterol inhaler twice and helped wheezing but didn't help cough at all. She is having sinus pressure and drainage. She was seen here 2 months ago with sinus infection and treated with levofloxacin. She states this feels similar to 2 months ago.  Nasal congestion: yes Otalgia: no Sore throat: yes, occasional Fever/chills: no Aggravating/alleviating factors: taking mucinex, robitussin and cough drops. History of asthma: exercise induced and sometimes triggered by URIs History of env allergies: yes, year round. Taking allegra as needed, states makes her too drowsy to take every day. Tobacco use: no  Review of Systems:  Review of Systems See HPI  There are no active problems to display for this patient.   Prior to Admission medications   Medication Sig Start Date End Date Taking? Authorizing Provider  Guaifenesin (MUCINEX MAXIMUM STRENGTH) 1200 MG TB12 Take 1 tablet (1,200 mg total) by mouth every 12 (twelve) hours as needed. 02/12/15  Yes Stephanie D English, PA  albuterol (PROVENTIL HFA;VENTOLIN HFA) 108 (90 BASE) MCG/ACT inhaler Inhale 1-2 puffs into the lungs every 6 (six) hours as needed for wheezing. Patient not taking: Reported on 02/12/2015 05/15/14   Ofilia NeasMichael L Clark, PA-C    Allergies  Allergen Reactions  . Amoxicillin     Past Surgical History  Procedure Laterality Date  . Cesarean  section      Social History  Substance Use Topics  . Smoking status: Never Smoker   . Smokeless tobacco: Never Used  . Alcohol Use: 0.0 oz/week    0 Standard drinks or equivalent per week    Family History  Problem Relation Age of Onset  . Breast cancer Maternal Grandmother   . Stroke Maternal Grandmother   . Heart attack Maternal Grandfather     Medication list has been reviewed and updated.  Physical Examination:  Physical Exam  Constitutional: She is oriented to person, place, and time. She appears well-developed and well-nourished. No distress.  HENT:  Head: Normocephalic and atraumatic.  Right Ear: Hearing, tympanic membrane, external ear and ear canal normal.  Left Ear: Hearing, tympanic membrane, external ear and ear canal normal.  Nose: Right sinus exhibits maxillary sinus tenderness (significant). Right sinus exhibits no frontal sinus tenderness. Left sinus exhibits maxillary sinus tenderness (significant). Left sinus exhibits no frontal sinus tenderness.  Mouth/Throat: Uvula is midline and mucous membranes are normal. Posterior oropharyngeal erythema present. No oropharyngeal exudate or posterior oropharyngeal edema.    Tongue with white lesion protruding, appears tubular. No tenderness with palpation.  Eyes: Conjunctivae and lids are normal. Right eye exhibits no discharge. Left eye exhibits no discharge. No scleral icterus.  Cardiovascular: Normal rate, regular rhythm, normal heart sounds and normal pulses.   No murmur heard. Pulmonary/Chest: Effort normal and breath sounds normal. No respiratory distress. She has no wheezes. She has no rhonchi. She has no rales.  Musculoskeletal: Normal range of motion.  Lymphadenopathy:       Head (right side): No submental, no submandibular and no tonsillar adenopathy present.       Head (left side): No submental, no submandibular and no tonsillar adenopathy present.    She has no cervical adenopathy.  Neurological: She is  alert and oriented to person, place, and time.  Skin: Skin is warm, dry and intact. No lesion and no rash noted.  Psychiatric: She has a normal mood and affect. Her speech is normal and behavior is normal. Thought content normal.    BP 122/76 mmHg  Pulse 73  Temp(Src) 98.1 F (36.7 C) (Oral)  Resp 16  Ht 5' 4.5" (1.638 m)  Wt 167 lb (75.751 kg)  BMI 28.23 kg/m2  SpO2 98%  Assessment and Plan:  1. Acute bronchitis, unspecified organism 2. Maxillary sinusitis 3. Asthma, exercise induced. Treat with doxy BID x 7 days. Advised scheduled albuterol every 4 hours for next 48 hours, then only prn for wheezing. Return if not improving in 1 week. - doxycycline (VIBRAMYCIN) 100 MG capsule; Take 1 capsule (100 mg total) by mouth 2 (two) times daily. AVOID EXCESS SUN EXPOSURE WHILE ON THIS MEDICATION  Dispense: 20 capsule; Refill: 0 - chlorpheniramine-HYDROcodone (TUSSIONEX PENNKINETIC ER) 10-8 MG/5ML SUER; Take 5 mLs by mouth every 12 (twelve) hours as needed for cough.  Dispense: 100 mL; Refill: 0 - ipratropium (ATROVENT) 0.03 % nasal spray; Place 2 sprays into both nostrils 2 (two) times daily.  Dispense: 30 mL; Refill: 0 - albuterol (PROVENTIL HFA;VENTOLIN HFA) 108 (90 BASE) MCG/ACT inhaler; Inhale 1-2 puffs into the lungs every 6 (six) hours as needed for wheezing.  Dispense: 1 Inhaler; Refill: 0  4. Tongue lesion Pt reports lesion not there this morning. Possibly from a burn, but lesion fairly large. She will monitor. If still there in 1 week, return.   Roswell Min[place] Dyke Brackett, MHS [facility]  04/29/2015

## 2015-05-20 DIAGNOSIS — Z01419 Encounter for gynecological examination (general) (routine) without abnormal findings: Secondary | ICD-10-CM | POA: Diagnosis not present

## 2015-05-20 DIAGNOSIS — Z1151 Encounter for screening for human papillomavirus (HPV): Secondary | ICD-10-CM | POA: Diagnosis not present

## 2015-05-20 DIAGNOSIS — Z6828 Body mass index (BMI) 28.0-28.9, adult: Secondary | ICD-10-CM | POA: Diagnosis not present

## 2015-05-20 DIAGNOSIS — Z124 Encounter for screening for malignant neoplasm of cervix: Secondary | ICD-10-CM | POA: Diagnosis not present

## 2015-05-20 DIAGNOSIS — Z30431 Encounter for routine checking of intrauterine contraceptive device: Secondary | ICD-10-CM | POA: Diagnosis not present

## 2015-05-20 DIAGNOSIS — Z130 Encounter for screening for diseases of the blood and blood-forming organs and certain disorders involving the immune mechanism: Secondary | ICD-10-CM | POA: Diagnosis not present

## 2015-05-20 DIAGNOSIS — Z1389 Encounter for screening for other disorder: Secondary | ICD-10-CM | POA: Diagnosis not present

## 2015-06-12 DIAGNOSIS — Z30433 Encounter for removal and reinsertion of intrauterine contraceptive device: Secondary | ICD-10-CM | POA: Diagnosis not present

## 2015-10-21 DIAGNOSIS — H5213 Myopia, bilateral: Secondary | ICD-10-CM | POA: Diagnosis not present

## 2016-04-20 ENCOUNTER — Ambulatory Visit (INDEPENDENT_AMBULATORY_CARE_PROVIDER_SITE_OTHER): Payer: 59 | Admitting: Physician Assistant

## 2016-04-20 ENCOUNTER — Ambulatory Visit (INDEPENDENT_AMBULATORY_CARE_PROVIDER_SITE_OTHER): Payer: 59

## 2016-04-20 VITALS — BP 112/68 | HR 68 | Temp 98.1°F | Resp 17 | Ht 64.5 in | Wt 174.0 lb

## 2016-04-20 DIAGNOSIS — J181 Lobar pneumonia, unspecified organism: Secondary | ICD-10-CM

## 2016-04-20 DIAGNOSIS — R059 Cough, unspecified: Secondary | ICD-10-CM

## 2016-04-20 DIAGNOSIS — J189 Pneumonia, unspecified organism: Secondary | ICD-10-CM

## 2016-04-20 DIAGNOSIS — R05 Cough: Secondary | ICD-10-CM

## 2016-04-20 LAB — POCT CBC
Granulocyte percent: 63.8 %G (ref 37–80)
HCT, POC: 37.5 % — AB (ref 37.7–47.9)
Hemoglobin: 13.1 g/dL (ref 12.2–16.2)
Lymph, poc: 2.4 (ref 0.6–3.4)
MCH: 30.4 pg (ref 27–31.2)
MCHC: 34.8 g/dL (ref 31.8–35.4)
MCV: 87.1 fL (ref 80–97)
MID (CBC): 0.7 (ref 0–0.9)
MPV: 7.1 fL (ref 0–99.8)
POC Granulocyte: 5.5 (ref 2–6.9)
POC LYMPH PERCENT: 28 %L (ref 10–50)
POC MID %: 8.2 % (ref 0–12)
Platelet Count, POC: 346 10*3/uL (ref 142–424)
RBC: 4.3 M/uL (ref 4.04–5.48)
RDW, POC: 12.8 %
WBC: 8.6 10*3/uL (ref 4.6–10.2)

## 2016-04-20 MED ORDER — BENZONATATE 100 MG PO CAPS: 100.0000 mg | ORAL_CAPSULE | Freq: Three times a day (TID) | ORAL | 0 refills | Status: DC | PRN

## 2016-04-20 MED ORDER — AZITHROMYCIN 250 MG PO TABS: ORAL_TABLET | ORAL | 0 refills | Status: DC

## 2016-04-20 MED ORDER — HYDROCOD POLST-CPM POLST ER 10-8 MG/5ML PO SUER
5.0000 mL | Freq: Two times a day (BID) | ORAL | 0 refills | Status: DC | PRN
Start: 2016-04-20 — End: 2017-10-11

## 2016-04-20 NOTE — Patient Instructions (Addendum)
Take antibiotics as prescribed. Follow up in 4-6 weeks for repeat xray to make sure this has cleared.     IF you received an x-ray today, you will receive an invoice from [facility]. Please contact [facility] at [phone] with questions or concerns regarding your invoice.   IF you received labwork today, you will receive an invoice from [facility]. Please contact [facility] at [phone] with questions or concerns regarding your invoice.   Our billing staff will not be able to assist you with questions regarding bills from these companies.  You will be contacted with the lab results as soon as they are available. The fastest way to get your results is to activate your My Chart account. Instructions are located on the last page of this paperwork. If you have not heard from us regarding the results in 2 weeks, please contact this office.      Community-Acquired Pneumonia, Adult Introduction Pneumonia is an infection of the lungs. One type of pneumonia can happen while a person is in a hospital. A different type can happen when a person is not in a hospital (community-acquired pneumonia). It is easy for this kind to spread from person to person. It can spread to you if you breathe near an infected person who coughs or sneezes. Some symptoms include:  A dry cough.  A wet (productive) cough.  Fever.  Sweating.  Chest pain. Follow these instructions at home:  Take over-the-counter and prescription medicines only as told by your doctor.  Only take cough medicine if you are losing sleep.  If you were prescribed an antibiotic medicine, take it as told by your doctor. Do not stop taking the antibiotic even if you start to feel better.  Sleep with your head and neck raised (elevated). You can do this by putting a few pillows under your head, or you can sleep in a recliner.  Do not use tobacco products. These include cigarettes, chewing tobacco, and e-cigarettes. If you  need help quitting, ask your doctor.  Drink enough water to keep your pee (urine) clear or pale yellow. A shot (vaccine) can help prevent pneumonia. Shots are often suggested for:  People older than 38 years of age.  People older than 38 years of age:  Who are having cancer treatment.  Who have long-term (chronic) lung disease.  Who have problems with their body's defense system (immune system). You may also prevent pneumonia if you take these actions:  Get the flu (influenza) shot every year.  Go to the dentist as often as told.  Wash your hands often. If soap and water are not available, use hand sanitizer. Contact a doctor if:  You have a fever.  You lose sleep because your cough medicine does not help. Get help right away if:  You are short of breath and it gets worse.  You have more chest pain.  Your sickness gets worse. This is very serious if:  You are an older adult.  Your body's defense system is weak.  You cough up blood. This information is not intended to replace advice given to you by your health care provider. Make sure you discuss any questions you have with your health care provider. Document Released: 10/07/2007 Document Revised: 09/26/2015 Document Reviewed: 08/15/2014  2017 Elsevier

## 2016-04-20 NOTE — Progress Notes (Signed)
MRN: [id number] DOB: 1976-04-29  Subjective:   Mallory Williams C Schillaci is a 40 y.o. female presenting for chief complaint of Cough (x 6-7days ) .  Pt notes she was congested, had fever, chills, rhinorrhea, and sore throat two months ago and then developed a dry/productive cough that has persisted for the past 6 weeks. It is worse in the morning and when she is trying to sleep at night. Has associated chest tightness when she is coughing. Has tried mucinex and albuterol inhaler for a week with no relief. Notes delsym helps intermittently. Denies fever, sinus congestion, ear fullness, sore throat, wheezing, shortness of breath and myalgia, night sweats, chills, fatigue, nausea, vomiting and diarrhea. Has not had any sick contact with anyone. No history of seasonal allergies. Has history of exercise induced asthma. Patient has had flu shot this season. No smoking. Has occasional alcohol use . Denies any other aggravating or relieving factors, no other questions or concerns.  [name] has a current medication list which includes the following prescription(s): albuterol and guaifenesin. Also is allergic to amoxicillin.  [name]  has no past medical history on file. Also  has a past surgical history that includes Cesarean section.   Objective:   Vitals: BP 112/68 (BP Location: Right Arm, Patient Position: Sitting, Cuff Size: Normal)   Pulse 68   Temp 98.1 F (36.7 C) (Oral)   Resp 17   Ht 5' 4.5" (1.638 m)   Wt 174 lb (78.9 kg)   SpO2 97%   BMI 29.41 kg/m   Physical Exam  Constitutional: She is oriented to person, place, and time. She appears well-developed and well-nourished.  HENT:  Head: Normocephalic and atraumatic.  Right Ear: Tympanic membrane, external ear and ear canal normal.  Left Ear: Tympanic membrane, external ear and ear canal normal.  Nose: Nose normal. Right sinus exhibits no maxillary sinus tenderness and no frontal sinus tenderness. Left sinus exhibits no maxillary sinus  tenderness and no frontal sinus tenderness.  Mouth/Throat: Uvula is midline, oropharynx is clear and moist and mucous membranes are normal.  Eyes: Conjunctivae are normal.  Neck: Normal range of motion.  Cardiovascular: Normal rate, regular rhythm and normal heart sounds.   Pulmonary/Chest: Effort normal and breath sounds normal.  Lymphadenopathy:       Head (right side): No submental, no submandibular, no tonsillar, no preauricular, no posterior auricular and no occipital adenopathy present.       Head (left side): No submental, no submandibular, no tonsillar, no preauricular, no posterior auricular and no occipital adenopathy present.    She has no cervical adenopathy.       Right: No supraclavicular adenopathy present.       Left: No supraclavicular adenopathy present.  Neurological: She is alert and oriented to person, place, and time.  Skin: Skin is warm and dry.  Psychiatric: She has a normal mood and affect.  Vitals reviewed.   Results for orders placed or performed in visit on 04/20/16 (from the past 24 hour(s))  POCT CBC     Status: Abnormal   Collection Time: 04/20/16  6:45 PM  Result Value Ref Range   WBC 8.6 4.6 - 10.2 K/uL   Lymph, poc 2.4 0.6 - 3.4   POC LYMPH PERCENT 28.0 10 - 50 %L   MID (cbc) 0.7 0 - 0.9   POC MID % 8.2 0 - 12 %M   POC Granulocyte 5.5 2 - 6.9   Granulocyte percent 63.8 37 - 80 %G   RBC  4.3 4.04 - 5.48 M/uL   Hemoglobin 13.1 12.2 - 16.2 g/dL   HCT, POC 95.237.5 (A) 84.137.7 - 47.9 %   MCV 87.1 80 - 97 fL   MCH, POC 30.4 27 - 31.2 pg   MCHC 34.8 31.8 - 35.4 g/dL   RDW, POC 32.412.8 %   Platelet Count, POC 346 142 - 424 K/uL   MPV 7.1 0 - 99.8 fL    Dg Chest 2 View  Result Date: 04/20/2016 CLINICAL DATA:  cough x 6 weeks EXAM: CHEST - 2 VIEW COMPARISON:  02/12/2015 FINDINGS: Focal infiltrate or subsegmental atelectasis in the anterior basal segment left lower lobe. Right lung clear. Attenuated upper lobe bronchovascular markings. Heart size and  mediastinal contours are within normal limits. No effusion. Visualized bones unremarkable. IMPRESSION: 1. Anterior left lower lobe infiltrate or atelectasis. Electronically Signed   By: Corlis Leak  Hassell M.D.   On: 04/20/2016 18:38     Assessment and Plan :  1. Cough - POCT CBC - DG Chest 2 View; Future - chlorpheniramine-HYDROcodone (TUSSIONEX PENNKINETIC ER) 10-8 MG/5ML SUER; Take 5 mLs by mouth every 12 (twelve) hours as needed for cough.  Dispense: 100 mL; Refill: 0 - benzonatate (TESSALON) 100 MG capsule; Take 1-2 capsules (100-200 mg total) by mouth 3 (three) times daily as needed for cough.  Dispense: 40 capsule; Refill: 0  2. Community acquired pneumonia of left lower lobe of lung (HCC) -Follow up in 4-6 weeks for repeat CXR to ensure resolution - azithromycin (ZITHROMAX) 250 MG tablet; Take 2 tabs PO x 1 dose, then 1 tab PO QD x 4 days  Dispense: 6 tablet; Refill: 0  Benjiman CoreBrittany Sevan Mcbroom, PA-C  [facility] 04/20/2016 6:50 PM

## 2016-05-13 ENCOUNTER — Ambulatory Visit: Payer: 59 | Admitting: Physician Assistant

## 2016-05-20 ENCOUNTER — Ambulatory Visit: Payer: 59 | Admitting: Physician Assistant

## 2016-09-22 IMAGING — CR DG CHEST 2V
2 series · 2 of 2 positions shown · non-contrast
Comparison: None.

CLINICAL DATA: Fever, body aches and chills.

EXAM:
CHEST  2 VIEW

[PA]
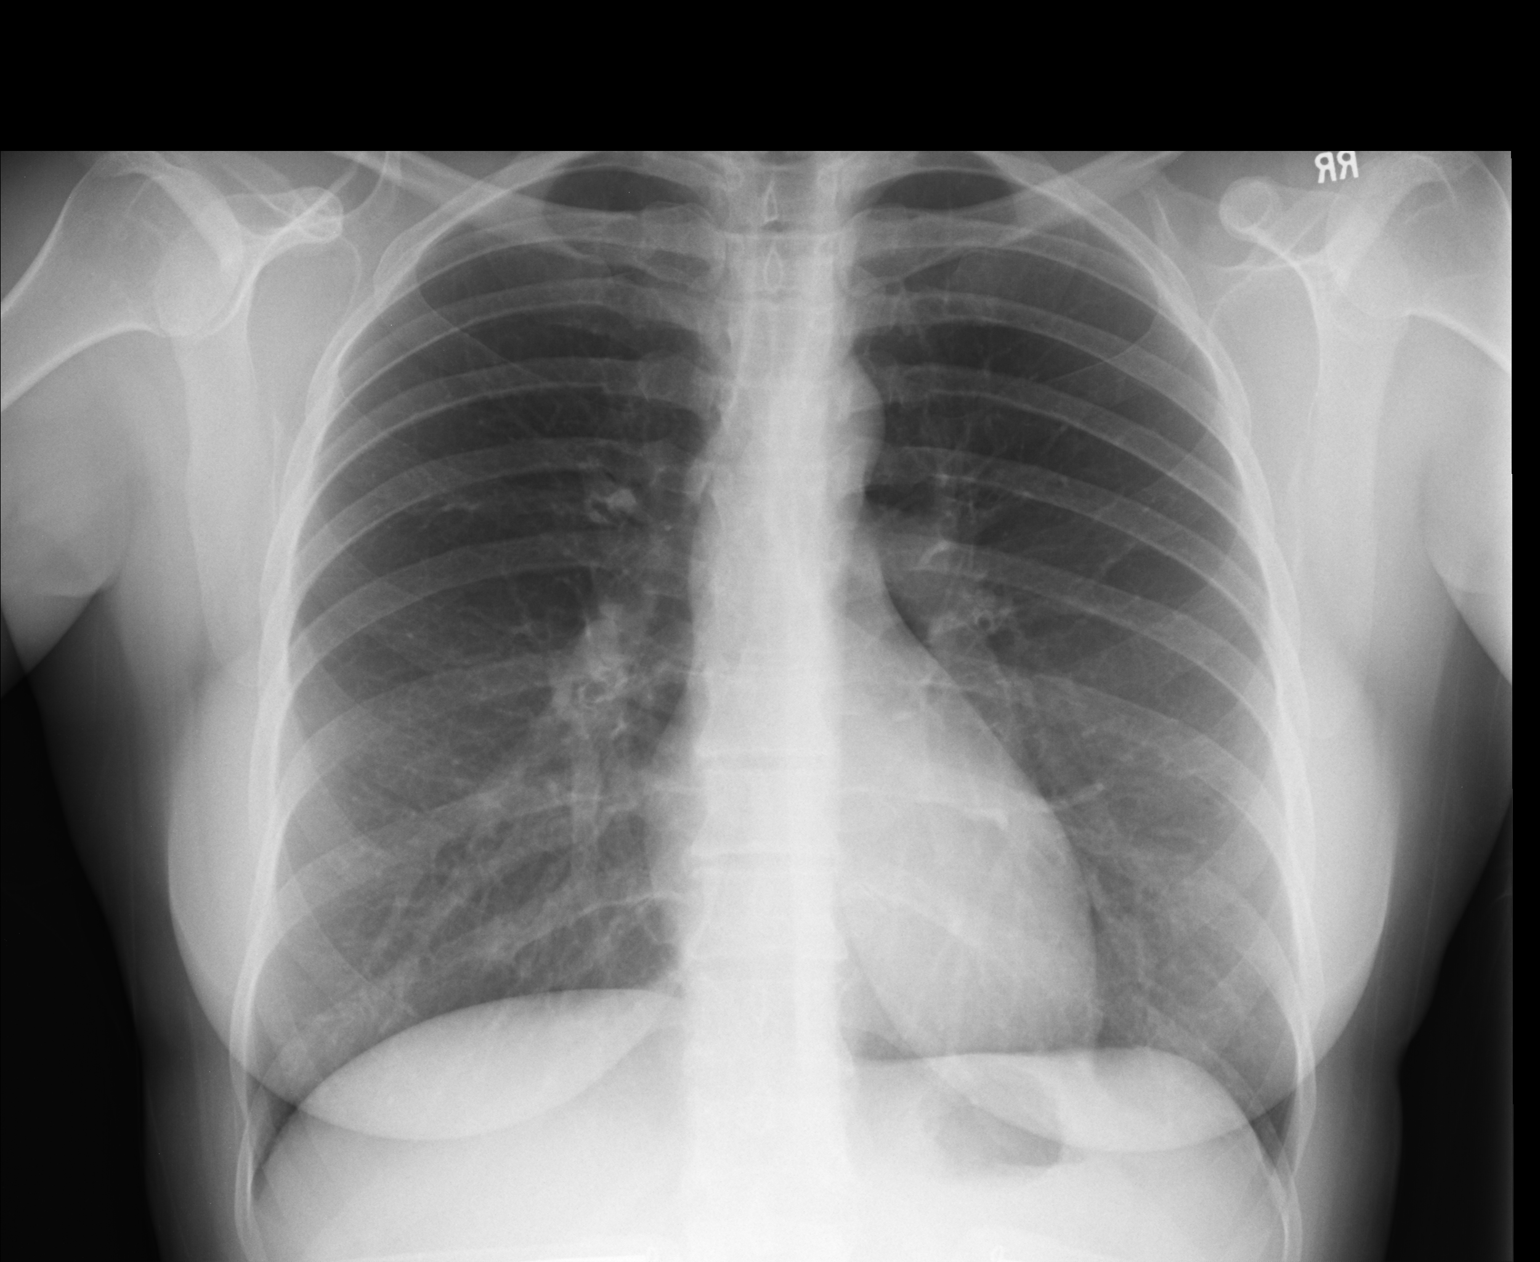

[lateral]
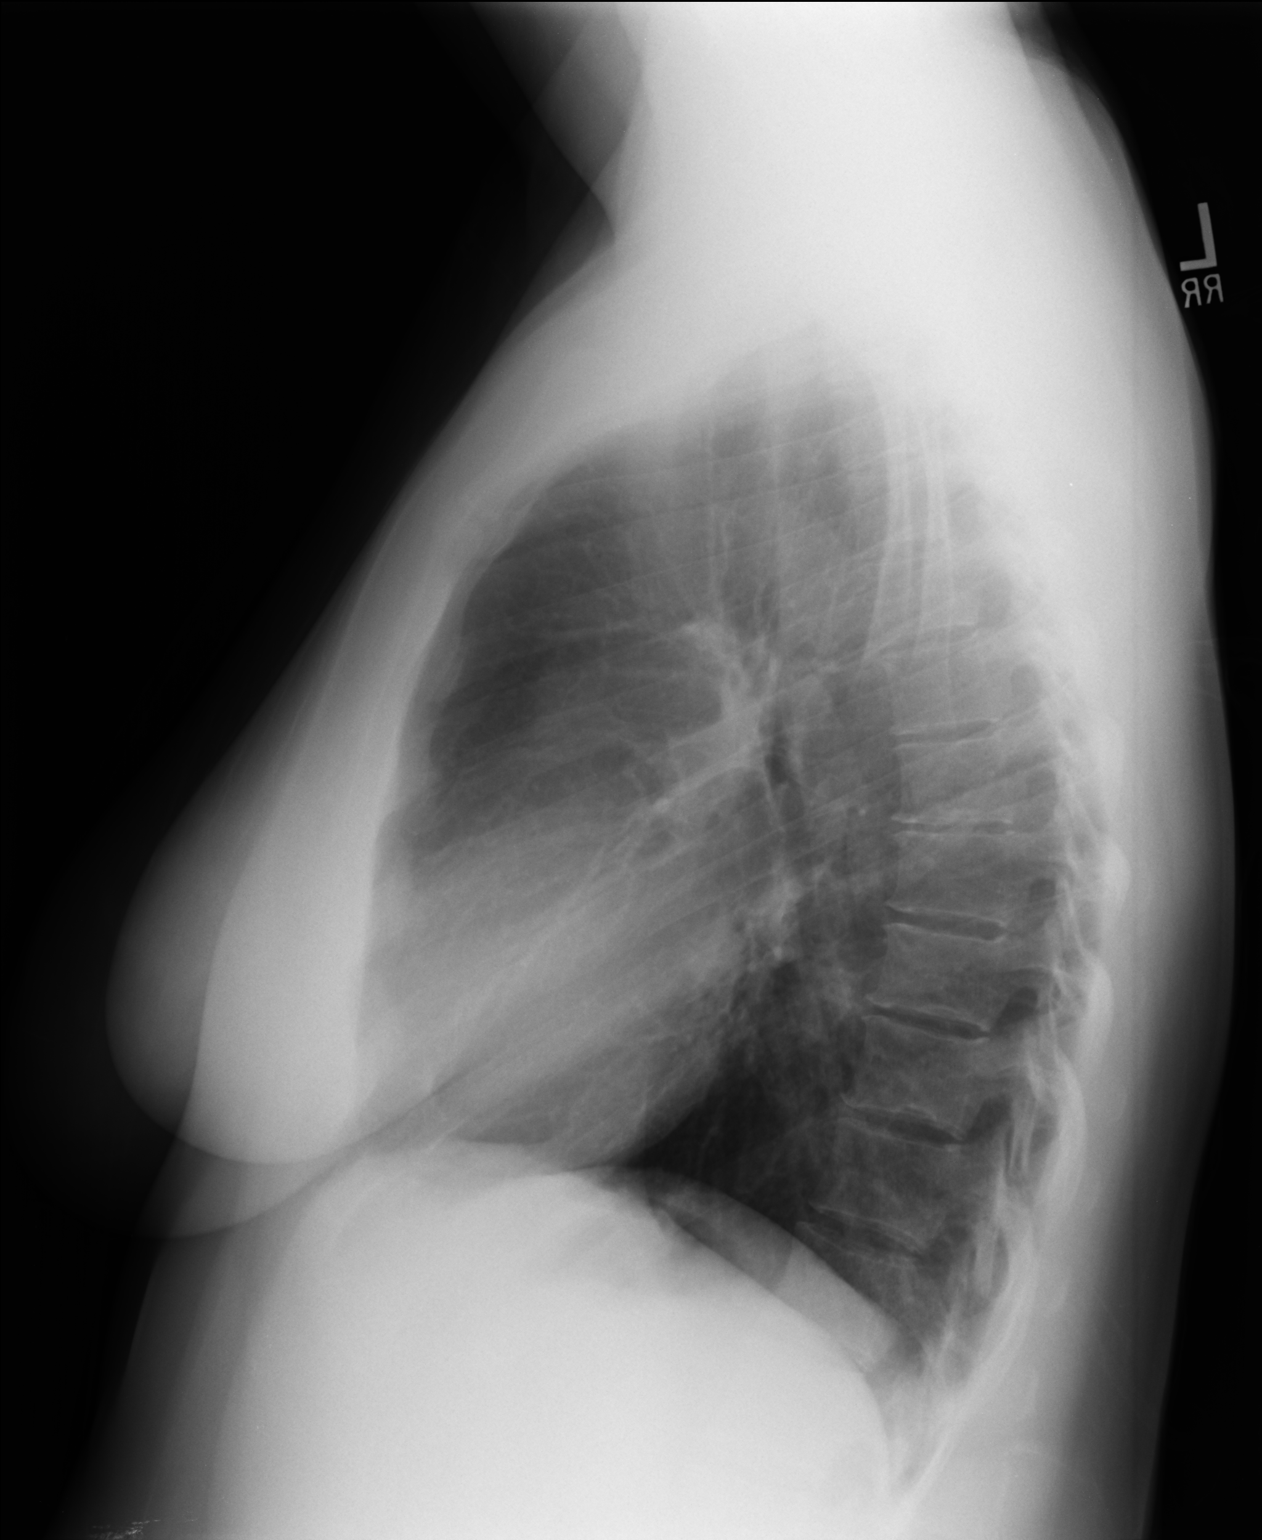

[2 of 2 positions shown; findings below may reference images not displayed]

FINDINGS: Cardiomediastinal silhouette is normal. Mediastinal contours appear
intact.

There is no evidence of focal airspace consolidation, pleural
effusion or pneumothorax.

Osseous structures are without acute abnormality. Soft tissues are
grossly normal.
IMPRESSION: No radiographic evidence of acute cardiopulmonary abnormality.

## 2017-04-19 ENCOUNTER — Ambulatory Visit: Payer: Self-pay | Admitting: *Deleted

## 2017-04-19 NOTE — Telephone Encounter (Signed)
Cough started last Friday, occassional vomitting when caughing starts; pt wants an appointment today but per Derald MacleodJohn, [place] [facility], none are available; pt offered and accepted appointment with Dr Alvy BimlerSagardia on 04/20/17 at 0840; pt verbalizes understanding; will route to [place] [facility] to notiiy them of this upcoming appointment. Reason for Disposition . SEVERE coughing spells (e.g., whooping sound after coughing, vomiting after coughing)  Answer Assessment - Initial Assessment Questions 1. ONSET: "When did the cough begin?"      Friday 04/16/17 2. SEVERITY: "How bad is the cough today?"      severe 3. RESPIRATORY DISTRESS: "Describe your breathing."      Chest feels tight when coughing spasm starts 4. FEVER: "Do you have a fever?" If so, ask: "What is your temperature, how was it measured, and when did it start?"     Yes over the weekend 99.0 today; 103.0 on 04/11/17  5. HEMOPTYSIS: "Are you coughing up any blood?" If so ask: "How much?" (flecks, streaks, tablespoons, etc.)     no 6. TREATMENT: "What have you done so far to treat the cough?" (e.g., meds, fluids, humidifier)     Delsym, and humidifier 7. CARDIAC HISTORY: "Do you have any history of heart disease?" (e.g., heart attack, congestive heart failure)      no 8. LUNG HISTORY: "Do you have any history of lung disease?"  (e.g., pulmonary embolus, asthma, emphysema)     Exercise induced asthma in her 3020s; does have an inhaler that she hasn't used since this time last year   9. PE RISK FACTORS: "Do you have a history of blood clots?" (or: recent major surgery, recent prolonged travel, bedridden )     no 10. OTHER SYMPTOMS: "Do you have any other symptoms? (e.g., runny nose, wheezing, chest pain)       Runny nose, wheezing at night per her husband 11. PREGNANCY: "Is there any chance you are pregnant?" "When was your last menstrual period?"       No mirena 12. TRAVEL: "Have you traveled out of the country in the last month?" (e.g., travel history,  exposures)       no  Protocols used: COUGH - ACUTE NON-PRODUCTIVE-A-AH

## 2017-04-20 ENCOUNTER — Ambulatory Visit (INDEPENDENT_AMBULATORY_CARE_PROVIDER_SITE_OTHER): Payer: 59 | Admitting: Emergency Medicine

## 2017-04-20 ENCOUNTER — Encounter: Payer: Self-pay | Admitting: Emergency Medicine

## 2017-04-20 ENCOUNTER — Other Ambulatory Visit: Payer: Self-pay

## 2017-04-20 VITALS — BP 110/70 | HR 85 | Temp 99.2°F | Resp 16 | Ht 63.75 in | Wt 170.6 lb

## 2017-04-20 DIAGNOSIS — J069 Acute upper respiratory infection, unspecified: Secondary | ICD-10-CM

## 2017-04-20 DIAGNOSIS — R05 Cough: Secondary | ICD-10-CM | POA: Diagnosis not present

## 2017-04-20 DIAGNOSIS — R059 Cough, unspecified: Secondary | ICD-10-CM

## 2017-04-20 DIAGNOSIS — J0100 Acute maxillary sinusitis, unspecified: Secondary | ICD-10-CM

## 2017-04-20 DIAGNOSIS — R0981 Nasal congestion: Secondary | ICD-10-CM

## 2017-04-20 MED ORDER — PSEUDOEPHEDRINE-GUAIFENESIN ER 60-600 MG PO TB12: 1.0000 | ORAL_TABLET | Freq: Two times a day (BID) | ORAL | 1 refills | Status: AC

## 2017-04-20 MED ORDER — BENZONATATE 200 MG PO CAPS: 200.0000 mg | ORAL_CAPSULE | Freq: Two times a day (BID) | ORAL | 0 refills | Status: DC | PRN

## 2017-04-20 MED ORDER — AZITHROMYCIN 250 MG PO TABS: ORAL_TABLET | ORAL | 0 refills | Status: DC

## 2017-04-20 NOTE — Progress Notes (Signed)
Mallory SarasKathryn C Williams 41 y.o.   Chief Complaint  Patient presents with  . Cough    x 2 weeks - nonproductive just dry  . Medication Refill    Albuterol inhaler    HISTORY OF PRESENT ILLNESS: This is a 41 y.o. female complaining of 10 day h/o flu-like symptoms, not getting better.  URI   This is a new problem. The current episode started 1 to 4 weeks ago. The problem has been gradually worsening. The maximum temperature recorded prior to her arrival was 103 - 104 F (1st 2 days; gone now). Associated symptoms include congestion, coughing, headaches, rhinorrhea, sinus pain, a sore throat and swollen glands. Pertinent negatives include no abdominal pain, chest pain, diarrhea, dysuria, ear pain, joint pain, nausea, neck pain, rash, vomiting or wheezing. Treatments tried: Delsym, DayQuil, NiQuil. The treatment provided mild relief.     Prior to Admission medications   Medication Sig Start Date End Date Taking? Authorizing Provider  albuterol (PROVENTIL HFA;VENTOLIN HFA) 108 (90 BASE) MCG/ACT inhaler Inhale 1-2 puffs into the lungs every 6 (six) hours as needed for wheezing. Patient not taking: Reported on 04/20/2017 04/29/15   Dorna LeitzBush, Nicole V, PA-C  azithromycin (ZITHROMAX) 250 MG tablet Take 2 tabs PO x 1 dose, then 1 tab PO QD x 4 days Patient not taking: Reported on 04/20/2017 04/20/16   Benjiman CoreWiseman, Brittany D, PA-C  benzonatate (TESSALON) 100 MG capsule Take 1-2 capsules (100-200 mg total) by mouth 3 (three) times daily as needed for cough. Patient not taking: Reported on 04/20/2017 04/20/16   Benjiman CoreWiseman, Brittany D, PA-C  chlorpheniramine-HYDROcodone [facility](TUSSIONEX PENNKINETIC ER) 10-8 MG/5ML SUER Take 5 mLs by mouth every 12 (twelve) hours as needed for cough. Patient not taking: Reported on 04/20/2017 04/20/16   Benjiman CoreWiseman, Brittany D, PA-C  Guaifenesin [facility](MUCINEX MAXIMUM STRENGTH) 1200 MG TB12 Take 1 tablet (1,200 mg total) by mouth every 12 (twelve) hours as needed. Patient not taking: Reported on  04/20/2017 02/12/15   Trena PlattEnglish, Stephanie D, PA    Allergies  Allergen Reactions  . Amoxicillin Rash    Patient Active Problem List   Diagnosis Date Noted  . Asthma, exercise induced 04/29/2015    No past medical history on file.  Past Surgical History:  Procedure Laterality Date  . CESAREAN SECTION      Social History   Socioeconomic History  . Marital status: Married    Spouse name: Not on file  . Number of children: Not on file  . Years of education: Not on file  . Highest education level: Not on file  Social Needs  . Financial resource strain: Not on file  . Food insecurity - worry: Not on file  . Food insecurity - inability: Not on file  . Transportation needs - medical: Not on file  . Transportation needs - non-medical: Not on file  Occupational History  . Not on file  Tobacco Use  . Smoking status: Never Smoker  . Smokeless tobacco: Never Used  Substance and Sexual Activity  . Alcohol use: Yes    Alcohol/week: 0.0 oz  . Drug use: No  . Sexual activity: Not on file  Other Topics Concern  . Not on file  Social History Narrative  . Not on file    Family History  Problem Relation Age of Onset  . Breast cancer Maternal Grandmother   . Stroke Maternal Grandmother   . Heart attack Maternal Grandfather      Review of Systems  Constitutional: Positive for fever. Negative for  chills.  HENT: Positive for congestion, rhinorrhea, sinus pain and sore throat. Negative for ear pain and nosebleeds.   Eyes: Negative for discharge and redness.  Respiratory: Positive for cough. Negative for sputum production, shortness of breath and wheezing.   Cardiovascular: Negative.  Negative for chest pain and palpitations.  Gastrointestinal: Negative for abdominal pain, blood in stool, diarrhea, nausea and vomiting.  Genitourinary: Negative for dysuria and urgency.  Musculoskeletal: Negative for joint pain and neck pain.  Skin: Negative for rash.  Neurological: Positive for  headaches. Negative for dizziness, sensory change and focal weakness.  Endo/Heme/Allergies: Negative.   All other systems reviewed and are negative.  Vitals:   04/20/17 0852  BP: 110/70  Pulse: 85  Resp: 16  Temp: 99.2 F (37.3 C)  SpO2: 97%     Physical Exam  Constitutional: She is oriented to person, place, and time. She appears well-developed and well-nourished.  HENT:  Head: Normocephalic and atraumatic.  Right Ear: Tympanic membrane, external ear and ear canal normal.  Left Ear: Tympanic membrane, external ear and ear canal normal.  Nose: Mucosal edema present. Right sinus exhibits maxillary sinus tenderness. Left sinus exhibits maxillary sinus tenderness.  Mouth/Throat: Uvula is midline. Posterior oropharyngeal erythema present. No oropharyngeal exudate or tonsillar abscesses.  Eyes: Conjunctivae and EOM are normal. Pupils are equal, round, and reactive to light.  Neck: Normal range of motion. Neck supple. No JVD present. No thyromegaly present.  Cardiovascular: Normal rate, regular rhythm and normal heart sounds.  Pulmonary/Chest: Effort normal and breath sounds normal. No respiratory distress. She has no wheezes. She has no rales.  Abdominal: Soft. Bowel sounds are normal. She exhibits no distension. There is no tenderness.  Musculoskeletal: Normal range of motion.  Lymphadenopathy:    She has no cervical adenopathy.  Neurological: She is alert and oriented to person, place, and time. No sensory deficit. She exhibits normal muscle tone. Coordination normal.  Skin: Skin is warm and dry. Capillary refill takes less than 2 seconds. No rash noted.  Small irritated open blister to left upper lip  Psychiatric: She has a normal mood and affect. Her behavior is normal.  Vitals reviewed.    ASSESSMENT & PLAN: Mallory Williams was seen today for cough and medication refill.  Diagnoses and all orders for this visit:  Acute non-recurrent maxillary sinusitis -     azithromycin  (ZITHROMAX) 250 MG tablet; Sig as indicated  Acute upper respiratory infection  Sinus congestion -     pseudoephedrine-guaifenesin (MUCINEX D) 60-600 MG 12 hr tablet; Take 1 tablet by mouth every 12 (twelve) hours for 5 days.  Cough -     benzonatate (TESSALON) 200 MG capsule; Take 1 capsule (200 mg total) by mouth 2 (two) times daily as needed for cough.    Patient Instructions       IF you received an x-ray today, you will receive an invoice from [facility]. Please contact [facility] at [phone] with questions or concerns regarding your invoice.   IF you received labwork today, you will receive an invoice from [facility]. Please contact [facility] at [phone] with questions or concerns regarding your invoice.   Our billing staff will not be able to assist you with questions regarding bills from these companies.  You will be contacted with the lab results as soon as they are available. The fastest way to get your results is to activate your My Chart account. Instructions are located on the last page of this paperwork. If you have not heard  from [place] regarding the results in 2 weeks, please contact this office.     Cough, Adult A cough helps to clear your throat and lungs. A cough may last only 2-3 weeks (acute), or it may last longer than 8 weeks (chronic). Many different things can cause a cough. A cough may be a sign of an illness or another medical condition. Follow these instructions at home:  Pay attention to any changes in your cough.  Take medicines only as told by your doctor. ? If you were prescribed an antibiotic medicine, take it as told by your doctor. Do not stop taking it even if you start to feel better. ? Talk with your doctor before you try using a cough medicine.  Drink enough fluid to keep your pee (urine) clear or pale yellow.  If the air is dry, use a cold steam vaporizer or humidifier in your home.  Stay away from things that make  you cough at work or at home.  If your cough is worse at night, try using extra pillows to raise your head up higher while you sleep.  Do not smoke, and try not to be around smoke. If you need help quitting, ask your doctor.  Do not have caffeine.  Do not drink alcohol.  Rest as needed. Contact a doctor if:  You have new problems (symptoms).  You cough up yellow fluid (pus).  Your cough does not get better after 2-3 weeks, or your cough gets worse.  Medicine does not help your cough and you are not sleeping well.  You have pain that gets worse or pain that is not helped with medicine.  You have a fever.  You are losing weight and you do not know why.  You have night sweats. Get help right away if:  You cough up blood.  You have trouble breathing.  Your heartbeat is very fast. This information is not intended to replace advice given to you by your health care provider. Make sure you discuss any questions you have with your health care provider. Document Released: 01/01/2011 Document Revised: 09/26/2015 Document Reviewed: 06/27/2014 Elsevier Interactive Patient Education  2018 [employer].  Sinusitis, Adult Sinusitis is soreness and inflammation of your sinuses. Sinuses are hollow spaces in the bones around your face. They are located:  Around your eyes.  In the middle of your forehead.  Behind your nose.  In your cheekbones.  Your sinuses and nasal passages are lined with a stringy fluid (mucus). Mucus normally drains out of your sinuses. When your nasal tissues get inflamed or swollen, the mucus can get trapped or blocked so air cannot flow through your sinuses. This lets bacteria, viruses, and funguses grow, and that leads to infection. Follow these instructions at home: Medicines  Take, use, or apply over-the-counter and prescription medicines only as told by your doctor. These may include nasal sprays.  If you were prescribed an antibiotic medicine, take  it as told by your doctor. Do not stop taking the antibiotic even if you start to feel better. Hydrate and Humidify  Drink enough water to keep your pee (urine) clear or pale yellow.  Use a cool mist humidifier to keep the humidity level in your home above 50%.  Breathe in steam for 10-15 minutes, 3-4 times a day or as told by your doctor. You can do this in the bathroom while a hot shower is running.  Try not to spend time in cool or dry air. Rest  Rest  as much as possible.  Sleep with your head raised (elevated).  Make sure to get enough sleep each night. General instructions  Put a warm, moist washcloth on your face 3-4 times a day or as told by your doctor. This will help with discomfort.  Wash your hands often with soap and water. If there is no soap and water, use hand sanitizer.  Do not smoke. Avoid being around people who are smoking (secondhand smoke).  Keep all follow-up visits as told by your doctor. This is important. Contact a doctor if:  You have a fever.  Your symptoms get worse.  Your symptoms do not get better within 10 days. Get help right away if:  You have a very bad headache.  You cannot stop throwing up (vomiting).  You have pain or swelling around your face or eyes.  You have trouble seeing.  You feel confused.  Your neck is stiff.  You have trouble breathing. This information is not intended to replace advice given to you by your health care provider. Make sure you discuss any questions you have with your health care provider. Document Released: 10/07/2007 Document Revised: 12/15/2015 Document Reviewed: 02/13/2015 Elsevier Interactive Patient Education  2018 Elsevier Inc.      Edwina BarthMiguel Malak Duchesneau, MD [facility]

## 2017-04-20 NOTE — Patient Instructions (Addendum)
IF you received an x-ray today, you will receive an invoice from [facility]. Please contact [facility] at [phone] with questions or concerns regarding your invoice.   IF you received labwork today, you will receive an invoice from [facility]. Please contact [facility] at [phone] with questions or concerns regarding your invoice.   Our billing staff will not be able to assist you with questions regarding bills from these companies.  You will be contacted with the lab results as soon as they are available. The fastest way to get your results is to activate your My Chart account. Instructions are located on the last page of this paperwork. If you have not heard from us regarding the results in 2 weeks, please contact this office.     Cough, Adult A cough helps to clear your throat and lungs. A cough may last only 2-3 weeks (acute), or it may last longer than 8 weeks (chronic). Many different things can cause a cough. A cough may be a sign of an illness or another medical condition. Follow these instructions at home:  Pay attention to any changes in your cough.  Take medicines only as told by your doctor. ? If you were prescribed an antibiotic medicine, take it as told by your doctor. Do not stop taking it even if you start to feel better. ? Talk with your doctor before you try using a cough medicine.  Drink enough fluid to keep your pee (urine) clear or pale yellow.  If the air is dry, use a cold steam vaporizer or humidifier in your home.  Stay away from things that make you cough at work or at home.  If your cough is worse at night, try using extra pillows to raise your head up higher while you sleep.  Do not smoke, and try not to be around smoke. If you need help quitting, ask your doctor.  Do not have caffeine.  Do not drink alcohol.  Rest as needed. Contact a doctor if:  You have new problems (symptoms).  You cough up yellow fluid  (pus).  Your cough does not get better after 2-3 weeks, or your cough gets worse.  Medicine does not help your cough and you are not sleeping well.  You have pain that gets worse or pain that is not helped with medicine.  You have a fever.  You are losing weight and you do not know why.  You have night sweats. Get help right away if:  You cough up blood.  You have trouble breathing.  Your heartbeat is very fast. This information is not intended to replace advice given to you by your health care provider. Make sure you discuss any questions you have with your health care provider. Document Released: 01/01/2011 Document Revised: 09/26/2015 Document Reviewed: 06/27/2014 Elsevier Interactive Patient Education  2018 [employer]  Sinusitis, Adult Sinusitis is soreness and inflammation of your sinuses. Sinuses are hollow spaces in the bones around your face. They are located:  Around your eyes.  In the middle of your forehead.  Behind your nose.  In your cheekbones.  Your sinuses and nasal passages are lined with a stringy fluid (mucus). Mucus normally drains out of your sinuses. When your nasal tissues get inflamed or swollen, the mucus can get trapped or blocked so air cannot flow through your sinuses. This lets bacteria, viruses, and funguses grow, and that leads to infection. Follow these instructions at home: Medicines  Take, use, or apply over-the-counter  and prescription medicines only as told by your doctor. These may include nasal sprays.  If you were prescribed an antibiotic medicine, take it as told by your doctor. Do not stop taking the antibiotic even if you start to feel better. Hydrate and Humidify  Drink enough water to keep your pee (urine) clear or pale yellow.  Use a cool mist humidifier to keep the humidity level in your home above 50%.  Breathe in steam for 10-15 minutes, 3-4 times a day or as told by your doctor. You can do this in the bathroom while  a hot shower is running.  Try not to spend time in cool or dry air. Rest  Rest as much as possible.  Sleep with your head raised (elevated).  Make sure to get enough sleep each night. General instructions  Put a warm, moist washcloth on your face 3-4 times a day or as told by your doctor. This will help with discomfort.  Wash your hands often with soap and water. If there is no soap and water, use hand sanitizer.  Do not smoke. Avoid being around people who are smoking (secondhand smoke).  Keep all follow-up visits as told by your doctor. This is important. Contact a doctor if:  You have a fever.  Your symptoms get worse.  Your symptoms do not get better within 10 days. Get help right away if:  You have a very bad headache.  You cannot stop throwing up (vomiting).  You have pain or swelling around your face or eyes.  You have trouble seeing.  You feel confused.  Your neck is stiff.  You have trouble breathing. This information is not intended to replace advice given to you by your health care provider. Make sure you discuss any questions you have with your health care provider. Document Released: 10/07/2007 Document Revised: 12/15/2015 Document Reviewed: 02/13/2015 Elsevier Interactive Patient Education  [employer]2018 Elsevier Inc.

## 2017-07-09 ENCOUNTER — Other Ambulatory Visit: Payer: Self-pay

## 2017-07-09 ENCOUNTER — Encounter: Payer: Self-pay | Admitting: Physician Assistant

## 2017-07-09 ENCOUNTER — Ambulatory Visit: Payer: 59 | Admitting: Physician Assistant

## 2017-07-09 VITALS — BP 127/81 | HR 90 | Temp 98.7°F | Resp 16 | Ht 65.0 in | Wt 169.0 lb

## 2017-07-09 DIAGNOSIS — J0100 Acute maxillary sinusitis, unspecified: Secondary | ICD-10-CM

## 2017-07-09 DIAGNOSIS — J209 Acute bronchitis, unspecified: Secondary | ICD-10-CM

## 2017-07-09 DIAGNOSIS — R0981 Nasal congestion: Secondary | ICD-10-CM | POA: Diagnosis not present

## 2017-07-09 DIAGNOSIS — R059 Cough, unspecified: Secondary | ICD-10-CM

## 2017-07-09 DIAGNOSIS — J45990 Exercise induced bronchospasm: Secondary | ICD-10-CM | POA: Diagnosis not present

## 2017-07-09 DIAGNOSIS — R05 Cough: Secondary | ICD-10-CM | POA: Diagnosis not present

## 2017-07-09 MED ORDER — ALBUTEROL SULFATE HFA 108 (90 BASE) MCG/ACT IN AERS: 1.0000 | INHALATION_SPRAY | Freq: Four times a day (QID) | RESPIRATORY_TRACT | 0 refills | Status: AC | PRN

## 2017-07-09 MED ORDER — AZITHROMYCIN 250 MG PO TABS: ORAL_TABLET | ORAL | 0 refills | Status: DC

## 2017-07-09 MED ORDER — PSEUDOEPHEDRINE-GUAIFENESIN ER 120-1200 MG PO TB12
1.0000 | ORAL_TABLET | Freq: Two times a day (BID) | ORAL | 0 refills | Status: DC
Start: 2017-07-09 — End: 2017-10-11

## 2017-07-09 MED ORDER — HYDROCODONE-HOMATROPINE 5-1.5 MG/5ML PO SYRP: 5.0000 mL | ORAL_SOLUTION | Freq: Three times a day (TID) | ORAL | 0 refills | Status: DC | PRN

## 2017-07-09 NOTE — Patient Instructions (Addendum)
Start azithromycin - take the entire course even if you start to feel better.  Hycodan is a hydrocone-based cough syrup. Take this at night for cough.   Stay well hydrated. Get lost of rest. Wash your hands often. Come back if you are not better in 7-10 days.    -Foods that can help speed recovery: honey, garlic, chicken soup, elderberries, green tea.  -Supplements that can help speed recovery: vitamin C, zinc, elderberry extract, quercetin, ginseng, selenium -Supplement with prebiotics and probiotics:   Advil or ibuprofen for pain. Do not take Aspirin.  Drink enough water and fluids to keep your urine clear or pale yellow.  For sore throat: ? Gargle with 8 oz of salt water ( tsp of salt per 1 qt of water) as often as every 1-2 hours to soothe your throat.  Gargle liquid benadryl.  Cepacol throat lozenges (if you are not at risk for choking).  For sore throat try using a honey-based tea. Use 3 teaspoons of honey with juice squeezed from half lemon. Place shaved pieces of ginger into 1/2-1 cup of water and warm over stove top. Then mix the ingredients and repeat every 4 hours as needed.  Cough Syrup Recipe: Sweet Lemon & Honey Thyme  Ingredients a handful of fresh thyme sprigs   1 pint of water (2 cups)  1/2 cup honey (raw is best, but regular will do)  1/2 lemon chopped Instructions 1. Place the lemon in the pint jar and cover with the honey. The honey will macerate the lemons and draw out liquids which taste so delicious! 2. Meanwhile, toss the thyme leaves into a saucepan and cover them with the water. 3. Bring the water to a gentle simmer and reduce it to half, about a cup of tea. 4. When the tea is reduced and cooled a bit, strain the sprigs & leaves, add it into the pint jar and stir it well. 5. Give it a shake and use a spoonful as needed. 6. Store your homemade cough syrup in the refrigerator for about a month.  What causes a cough? In adults, common causes of a cough  include: ?An infection of the airways or lungs (such as the common cold) ?Postnasal drip - Postnasal drip is when mucus from the nose drips down or flows along the back of the throat. Postnasal drip can happen when people have: .A cold .Allergies .A sinus infection - The sinuses are hollow areas in the bones of the face that open into the nose. ?Lung conditions, like asthma and chronic obstructive pulmonary disease (COPD) - Both of these conditions can make it hard to breathe. COPD is usually caused by smoking. ?Acid reflux - Acid reflux is when the acid that is normally in your stomach backs up into your esophagus (the tube that carries food from your mouth to your stomach). ?A side effect from blood pressure medicines called "ACE inhibitors" ?Smoking cigarettes  Is there anything I can do on my own to get rid of my cough? Yes. To help get rid of your cough, you can: ?Use a humidifier in your bedroom ?Use an over-the-counter cough medicine, or suck on cough drops or hard candy ?Stop smoking, if you smoke ?If you have allergies, avoid the things you are allergic to (like pollen, dust, animals, or mold) If you have acid reflux, your doctor or nurse will tell you which lifestyle changes can help reduce symptoms.   Thank you for coming in today. I hope you feel we  met your needs.  Feel free to call PCP if you have any questions or further requests.  Please consider signing up for MyChart if you do not already have it, as this is a great way to communicate with me.  Best,  Whitney McVey, PA-C   IF you received an x-ray today, you will receive an invoice from [facility]. Please contact [facility] at [phone] with questions or concerns regarding your invoice.   IF you received labwork today, you will receive an invoice from [facility]. Please contact [facility] at [phone] with questions or concerns regarding your invoice.   Our billing staff will not be able to  assist you with questions regarding bills from these companies.  You will be contacted with the lab results as soon as they are available. The fastest way to get your results is to activate your My Chart account. Instructions are located on the last page of this paperwork. If you have not heard from [place] regarding the results in 2 weeks, please contact this office.

## 2017-07-09 NOTE — Progress Notes (Signed)
Mallory SarasKathryn C Williams  MRN: [id number] DOB: [birth year]  PCP: Patient, No Pcp Per  Subjective:  Pt is a 42 year old female who presents to clinic for cough x 6 days. Cough is worse at night. Endorses some chest tightness. Endorses HA with sinus pressure, nasal drainage, diaphoresis and fatigue.  Denies fever, chills, night sweats.  She has tried DayQuil, Sudafed, robitussen.  She would like refill of Albuterol. She uses this during allergy season as needed.   Review of Systems  Constitutional: Negative for chills, diaphoresis, fatigue and fever.  HENT: Positive for congestion, postnasal drip, rhinorrhea and sinus pressure. Negative for sore throat.   Respiratory: Positive for cough and chest tightness. Negative for shortness of breath and wheezing.   Cardiovascular: Negative for chest pain and palpitations.  Gastrointestinal: Negative for nausea and vomiting.  Neurological: Positive for headaches.  Psychiatric/Behavioral: Positive for sleep disturbance.    Patient Active Problem List   Diagnosis Date Noted  . Acute non-recurrent maxillary sinusitis 04/20/2017  . Asthma, exercise induced 04/29/2015    Current Outpatient Medications on File Prior to Visit  Medication Sig Dispense Refill  . albuterol (PROVENTIL HFA;VENTOLIN HFA) 108 (90 BASE) MCG/ACT inhaler Inhale 1-2 puffs into the lungs every 6 (six) hours as needed for wheezing. (Patient not taking: Reported on 04/20/2017) 1 Inhaler 0  . azithromycin (ZITHROMAX) 250 MG tablet Take 2 tabs PO x 1 dose, then 1 tab PO QD x 4 days (Patient not taking: Reported on 04/20/2017) 6 tablet 0  . azithromycin (ZITHROMAX) 250 MG tablet Sig as indicated 6 tablet 0  . benzonatate (TESSALON) 100 MG capsule Take 1-2 capsules (100-200 mg total) by mouth 3 (three) times daily as needed for cough. (Patient not taking: Reported on 04/20/2017) 40 capsule 0  . benzonatate (TESSALON) 200 MG capsule Take 1 capsule (200 mg total) by mouth 2 (two) times daily as  needed for cough. 20 capsule 0  . chlorpheniramine-HYDROcodone (TUSSIONEX PENNKINETIC ER) 10-8 MG/5ML SUER Take 5 mLs by mouth every 12 (twelve) hours as needed for cough. (Patient not taking: Reported on 04/20/2017) 100 mL 0  . Guaifenesin (MUCINEX MAXIMUM STRENGTH) 1200 MG TB12 Take 1 tablet (1,200 mg total) by mouth every 12 (twelve) hours as needed. (Patient not taking: Reported on 04/20/2017) 14 tablet 1   No current facility-administered medications on file prior to visit.     Allergies  Allergen Reactions  . Amoxicillin Rash     Objective:  BP 127/81   Pulse 90   Temp 98.7 F (37.1 C) (Oral)   Resp 16   Ht 5\' 5"  (1.651 m)   Wt 169 lb (76.7 kg)   SpO2 95%   BMI 28.12 kg/m   Physical Exam  Constitutional: She is oriented to person, place, and time and well-developed, well-nourished, and in no distress. No distress.  HENT:  Right Ear: Tympanic membrane normal.  Left Ear: Tympanic membrane is bulging.  Nose: Mucosal edema present. No rhinorrhea. Right sinus exhibits no maxillary sinus tenderness and no frontal sinus tenderness. Left sinus exhibits no maxillary sinus tenderness and no frontal sinus tenderness.  Mouth/Throat: Oropharynx is clear and moist and mucous membranes are normal.  Cardiovascular: Normal rate, regular rhythm and normal heart sounds.  Pulmonary/Chest: Effort normal and breath sounds normal. No respiratory distress. She has no wheezes. She has no rales.  Neurological: She is alert and oriented to person, place, and time. GCS score is 15.  Skin: Skin is warm. She is diaphoretic.  Psychiatric:  Mood, memory, affect and judgment normal.  Vitals reviewed.   Assessment and Plan :  1. Acute bronchitis, unspecified organism 2. Cough 3. Acute non-recurrent maxillary sinusitis 4. Nasal congestion - HYDROcodone-homatropine (HYCODAN) 5-1.5 MG/5ML syrup; Take 5 mLs by mouth every 8 (eight) hours as needed for cough.  Dispense: 120 mL; Refill: 0 - azithromycin  (ZITHROMAX) 250 MG tablet; Sig as indicated  Dispense: 6 tablet; Refill: 0 - Pseudoephedrine-Guaifenesin (MUCINEX D MAX STRENGTH) [id number] MG TB12; Take 1 tablet by mouth every 12 (twelve) hours.  Dispense: 20 each; Refill: 0 - pt presents with worsening cough x 6 days. Plan to cover with azithromycin. RTC if no improvement in 7-10 days. Advised hydration.  5. Asthma, exercise induced - albuterol (PROVENTIL HFA;VENTOLIN HFA) 108 (90 Base) MCG/ACT inhaler; Inhale 1-2 puffs into the lungs every 6 (six) hours as needed for wheezing.  Dispense: 1 Inhaler; Refill: 0 - Last refill 2017. OK to refill.   Marco Collie, PA-C  Primary Care at [facility] 07/09/2017 2:01 PM

## 2017-10-11 ENCOUNTER — Other Ambulatory Visit: Payer: Self-pay

## 2017-10-11 ENCOUNTER — Ambulatory Visit (INDEPENDENT_AMBULATORY_CARE_PROVIDER_SITE_OTHER): Payer: 59 | Admitting: Physician Assistant

## 2017-10-11 ENCOUNTER — Encounter: Payer: Self-pay | Admitting: Physician Assistant

## 2017-10-11 VITALS — BP 110/74 | HR 76 | Temp 98.1°F | Resp 16 | Ht 64.0 in | Wt 168.8 lb

## 2017-10-11 DIAGNOSIS — Z13220 Encounter for screening for lipoid disorders: Secondary | ICD-10-CM

## 2017-10-11 DIAGNOSIS — Z13228 Encounter for screening for other metabolic disorders: Secondary | ICD-10-CM | POA: Diagnosis not present

## 2017-10-11 DIAGNOSIS — Z0000 Encounter for general adult medical examination without abnormal findings: Secondary | ICD-10-CM | POA: Diagnosis not present

## 2017-10-11 DIAGNOSIS — Z130 Encounter for screening for diseases of the blood and blood-forming organs and certain disorders involving the immune mechanism: Secondary | ICD-10-CM | POA: Diagnosis not present

## 2017-10-11 DIAGNOSIS — Z1329 Encounter for screening for other suspected endocrine disorder: Secondary | ICD-10-CM | POA: Diagnosis not present

## 2017-10-11 LAB — POCT URINALYSIS DIP (MANUAL ENTRY)
Bilirubin, UA: NEGATIVE
Blood, UA: NEGATIVE
Glucose, UA: NEGATIVE mg/dL
Ketones, POC UA: NEGATIVE mg/dL
Leukocytes, UA: NEGATIVE
Nitrite, UA: NEGATIVE
Protein Ur, POC: NEGATIVE mg/dL
Spec Grav, UA: 1.025 (ref 1.010–1.025)
Urobilinogen, UA: 0.2 E.U./dL
pH, UA: 6 (ref 5.0–8.0)

## 2017-10-11 NOTE — Patient Instructions (Addendum)
It was great to see you today. Thank you for allowing me to take part in the care of your health.  We will contact you with your lab results when they come back.  Please follow-up with your GYN regarding a mammogram.   Health Maintenance, Female Adopting a healthy lifestyle and getting preventive care can go a long way to promote health and wellness. Talk with your health care provider about what schedule of regular examinations is right for you. This is a good chance for you to check in with your provider about disease prevention and staying healthy. In between checkups, there are plenty of things you can do on your own. Experts have done a lot of research about which lifestyle changes and preventive measures are most likely to keep you healthy. Ask your health care provider for more information. Weight and diet Eat a healthy diet  Be sure to include plenty of vegetables, fruits, low-fat dairy products, and lean protein.  Do not eat a lot of foods high in solid fats, added sugars, or salt.  Get regular exercise. This is one of the most important things you can do for your health. ? Most adults should exercise for at least 150 minutes each week. The exercise should increase your heart rate and make you sweat (moderate-intensity exercise). ? Most adults should also do strengthening exercises at least twice a week. This is in addition to the moderate-intensity exercise.  Maintain a healthy weight  Body mass index (BMI) is a measurement that can be used to identify possible weight problems. It estimates body fat based on height and weight. Your health care provider can help determine your BMI and help you achieve or maintain a healthy weight.  For females 110 years of age and older: ? A BMI below 18.5 is considered underweight. ? A BMI of 18.5 to 24.9 is normal. ? A BMI of 25 to 29.9 is considered overweight. ? A BMI of 30 and above is considered obese.  Watch levels of cholesterol and blood  lipids  You should start having your blood tested for lipids and cholesterol at 40 years of age, then have this test every 5 years.  You may need to have your cholesterol levels checked more often if: ? Your lipid or cholesterol levels are high. ? You are older than 40 years of age. ? You are at high risk for heart disease.  Cancer screening Lung Cancer  Lung cancer screening is recommended for adults 34-34 years old who are at high risk for lung cancer because of a history of smoking.  A yearly low-dose CT scan of the lungs is recommended for people who: ? Currently smoke. ? Have quit within the past 15 years. ? Have at least a 30-pack-year history of smoking. A pack year is smoking an average of one pack of cigarettes a day for 1 year.  Yearly screening should continue until it has been 15 years since you quit.  Yearly screening should stop if you develop a health problem that would prevent you from having lung cancer treatment.  Breast Cancer  Practice breast self-awareness. This means understanding how your breasts normally appear and feel.  It also means doing regular breast self-exams. Let your health care provider know about any changes, no matter how small.  If you are in your 20s or 30s, you should have a clinical breast exam (CBE) by a health care provider every 1-3 years as part of a regular health exam.  If  you are 40 or older, have a CBE every year. Also consider having a breast X-ray (mammogram) every year.  If you have a family history of breast cancer, talk to your health care provider about genetic screening.  If you are at high risk for breast cancer, talk to your health care provider about having an MRI and a mammogram every year.  Breast cancer gene (BRCA) assessment is recommended for women who have family members with BRCA-related cancers. BRCA-related cancers include: ? Breast. ? Ovarian. ? Tubal. ? Peritoneal cancers.  Results of the assessment will  determine the need for genetic counseling and BRCA1 and BRCA2 testing.  Cervical Cancer Your health care provider may recommend that you be screened regularly for cancer of the pelvic organs (ovaries, uterus, and vagina). This screening involves a pelvic examination, including checking for microscopic changes to the surface of your cervix (Pap test). You may be encouraged to have this screening done every 3 years, beginning at age 55.  For women ages 34-65, health care providers may recommend pelvic exams and Pap testing every 3 years, or they may recommend the Pap and pelvic exam, combined with testing for human papilloma virus (HPV), every 5 years. Some types of HPV increase your risk of cervical cancer. Testing for HPV may also be done on women of any age with unclear Pap test results.  Other health care providers may not recommend any screening for nonpregnant women who are considered low risk for pelvic cancer and who do not have symptoms. Ask your health care provider if a screening pelvic exam is right for you.  If you have had past treatment for cervical cancer or a condition that could lead to cancer, you need Pap tests and screening for cancer for at least 20 years after your treatment. If Pap tests have been discontinued, your risk factors (such as having a new sexual partner) need to be reassessed to determine if screening should resume. Some women have medical problems that increase the chance of getting cervical cancer. In these cases, your health care provider may recommend more frequent screening and Pap tests.  Colorectal Cancer  This type of cancer can be detected and often prevented.  Routine colorectal cancer screening usually begins at 41 years of age and continues through 40 years of age.  Your health care provider may recommend screening at an earlier age if you have risk factors for colon cancer.  Your health care provider may also recommend using home test kits to check  for hidden blood in the stool.  A small camera at the end of a tube can be used to examine your colon directly (sigmoidoscopy or colonoscopy). This is done to check for the earliest forms of colorectal cancer.  Routine screening usually begins at age 58.  Direct examination of the colon should be repeated every 5-10 years through 40 years of age. However, you may need to be screened more often if early forms of precancerous polyps or small growths are found.  Skin Cancer  Check your skin from head to toe regularly.  Tell your health care provider about any new moles or changes in moles, especially if there is a change in a mole's shape or color.  Also tell your health care provider if you have a mole that is larger than the size of a pencil eraser.  Always use sunscreen. Apply sunscreen liberally and repeatedly throughout the day.  Protect yourself by wearing long sleeves, pants, a wide-brimmed hat, and sunglasses  whenever you are outside.  Heart disease, diabetes, and high blood pressure  High blood pressure causes heart disease and increases the risk of stroke. High blood pressure is more likely to develop in: ? People who have blood pressure in the high end of the normal range (130-139/85-89 mm Hg). ? People who are overweight or obese. ? People who are African American.  If you are 80-64 years of age, have your blood pressure checked every 3-5 years. If you are 52 years of age or older, have your blood pressure checked every year. You should have your blood pressure measured twice-once when you are at a hospital or clinic, and once when you are not at a hospital or clinic. Record the average of the two measurements. To check your blood pressure when you are not at a hospital or clinic, you can use: ? An automated blood pressure machine at a pharmacy. ? A home blood pressure monitor.  If you are between 58 years and 58 years old, ask your health care provider if you should take  aspirin to prevent strokes.  Have regular diabetes screenings. This involves taking a blood sample to check your fasting blood sugar level. ? If you are at a normal weight and have a low risk for diabetes, have this test once every three years after 40 years of age. ? If you are overweight and have a high risk for diabetes, consider being tested at a younger age or more often. Preventing infection Hepatitis B  If you have a higher risk for hepatitis B, you should be screened for this virus. You are considered at high risk for hepatitis B if: ? You were born in a country where hepatitis B is common. Ask your health care provider which countries are considered high risk. ? Your parents were born in a high-risk country, and you have not been immunized against hepatitis B (hepatitis B vaccine). ? You have HIV or AIDS. ? You use needles to inject street drugs. ? You live with someone who has hepatitis B. ? You have had sex with someone who has hepatitis B. ? You get hemodialysis treatment. ? You take certain medicines for conditions, including cancer, organ transplantation, and autoimmune conditions.  Hepatitis C  Blood testing is recommended for: ? Everyone born from [birth year] through 1963. ? Anyone with known risk factors for hepatitis C.  Sexually transmitted infections (STIs)  You should be screened for sexually transmitted infections (STIs) including gonorrhea and chlamydia if: ? You are sexually active and are younger than 40 years of age. ? You are older than 40 years of age and your health care provider tells you that you are at risk for this type of infection. ? Your sexual activity has changed since you were last screened and you are at an increased risk for chlamydia or gonorrhea. Ask your health care provider if you are at risk.  If you do not have HIV, but are at risk, it may be recommended that you take a prescription medicine daily to prevent HIV infection. This is called  pre-exposure prophylaxis (PrEP). You are considered at risk if: ? You are sexually active and do not regularly use condoms or know the HIV status of your partner(s). ? You take drugs by injection. ? You are sexually active with a partner who has HIV.  Talk with your health care provider about whether you are at high risk of being infected with HIV. If you choose to begin PrEP, you should  first be tested for HIV. You should then be tested every 3 months for as long as you are taking PrEP. Pregnancy  If you are premenopausal and you may become pregnant, ask your health care provider about preconception counseling.  If you may become pregnant, take 400 to 800 micrograms (mcg) of folic acid every day.  If you want to prevent pregnancy, talk to your health care provider about birth control (contraception). Osteoporosis and menopause  Osteoporosis is a disease in which the bones lose minerals and strength with aging. This can result in serious bone fractures. Your risk for osteoporosis can be identified using a bone density scan.  If you are 46 years of age or older, or if you are at risk for osteoporosis and fractures, ask your health care provider if you should be screened.  Ask your health care provider whether you should take a calcium or vitamin D supplement to lower your risk for osteoporosis.  Menopause may have certain physical symptoms and risks.  Hormone replacement therapy may reduce some of these symptoms and risks. Talk to your health care provider about whether hormone replacement therapy is right for you. Follow these instructions at home:  Schedule regular health, dental, and eye exams.  Stay current with your immunizations.  Do not use any tobacco products including cigarettes, chewing tobacco, or electronic cigarettes.  If you are pregnant, do not drink alcohol.  If you are breastfeeding, limit how much and how often you drink alcohol.  Limit alcohol intake to no more  than 1 drink per day for nonpregnant women. One drink equals 12 ounces of beer, 5 ounces of wine, or 1 ounces of hard liquor.  Do not use street drugs.  Do not share needles.  Ask your health care provider for help if you need support or information about quitting drugs.  Tell your health care provider if you often feel depressed.  Tell your health care provider if you have ever been abused or do not feel safe at home. This information is not intended to replace advice given to you by your health care provider. Make sure you discuss any questions you have with your health care provider. Document Released: 11/03/2010 Document Revised: 09/26/2015 Document Reviewed: 01/22/2015 Elsevier Interactive Patient Education  2018 [employer].   IF you received an x-ray today, you will receive an invoice from [facility]. Please contact [facility] at [phone] with questions or concerns regarding your invoice.   IF you received labwork today, you will receive an invoice from [facility]. Please contact [facility] at [phone] with questions or concerns regarding your invoice.   Our billing staff will not be able to assist you with questions regarding bills from these companies.  You will be contacted with the lab results as soon as they are available. The fastest way to get your results is to activate your My Chart account. Instructions are located on the last page of this paperwork. If you have not heard from [place] regarding the results in 2 weeks, please contact this office.

## 2017-10-11 NOTE — Progress Notes (Signed)
Primary Care at [place], [place] [address] [phone]  Date:  10/11/2017   Name:  Mallory Williams   DOB:  Oct 30, 1975   MRN:  [id number]  PCP:  Patient, No Pcp Per    Chief Complaint: Annual Exam (no pap, last pap 12/2016 Normal)   History of Present Illness:  This is a 42 y.o. female with PMH seasonal allergies and exercise induced asthma who is presenting for CPE. She works as a Paramedic for [employer].   Complaints: none LMP: 09/15/2017 Contraception: IUD Last pap: 12/2016. Negative. Sees GYN regularly. Significant FHx breast cancer, plans to get MM early.  Sexual history: active with husband only. No concern for STD screening today. She has triplets who are 40 years old.  Immunizations: UTD Dentist: regularly  Eye: 20/15 b/l  Diet/Exercise:  Mostly eats healthy diet. She enjoys running. Goes to the gym a few times a week.  Fam hx: family history includes Breast cancer in her maternal grandmother; Heart attack in her maternal grandfather; Hypertension in her brother; Stroke in her maternal grandmother.  Tobacco/alcohol/substance use: Never Smoker, 1.8 - 2.4 oz alcohol/week   Review of Systems:  Review of Systems  Constitutional: Negative for chills, diaphoresis, fatigue and fever.  HENT: Negative for congestion, postnasal drip, rhinorrhea, sinus pressure, sneezing and sore throat.   Respiratory: Negative for cough, chest tightness, shortness of breath and wheezing.   Cardiovascular: Negative for chest pain and palpitations.  Gastrointestinal: Negative for abdominal pain, diarrhea, nausea and vomiting.  Endocrine: Negative for cold intolerance, heat intolerance, polydipsia, polyphagia and polyuria.  Genitourinary: Negative for decreased urine volume, difficulty urinating, dysuria, enuresis, flank pain, frequency, hematuria, urgency, vaginal bleeding, vaginal discharge and vaginal pain.  Musculoskeletal: Negative for back pain.  Skin: Negative.    Allergic/Immunologic: Positive for environmental allergies.  Neurological: Negative for dizziness, weakness, light-headedness and headaches.    Patient Active Problem List   Diagnosis Date Noted  . Acute non-recurrent maxillary sinusitis 04/20/2017  . Asthma, exercise induced 04/29/2015    Prior to Admission medications   Medication Sig Start Date End Date Taking? Authorizing Provider  albuterol (PROVENTIL HFA;VENTOLIN HFA) 108 (90 Base) MCG/ACT inhaler Inhale 1-2 puffs into the lungs every 6 (six) hours as needed for wheezing. 07/09/17  Yes Huberta Tompkins, Gelene Mink, PA-C    Allergies  Allergen Reactions  . Amoxicillin Rash    Past Surgical History:  Procedure Laterality Date  . CESAREAN SECTION      Social History   Tobacco Use  . Smoking status: Never Smoker  . Smokeless tobacco: Never Used  Substance Use Topics  . Alcohol use: Yes    Alcohol/week: 1.8 - 2.4 oz    Types: 3 - 4 Standard drinks or equivalent per week  . Drug use: No    Family History  Problem Relation Age of Onset  . Breast cancer Maternal Grandmother   . Stroke Maternal Grandmother   . Heart attack Maternal Grandfather   . Hypertension Brother     Medication list has been reviewed and updated.  Physical Examination:  Physical Exam  Constitutional: She is oriented to person, place, and time. She appears well-developed and well-nourished. No distress.  HENT:  Head: Normocephalic and atraumatic.  Mouth/Throat: Oropharynx is clear and moist.  Eyes: Pupils are equal, round, and reactive to light. Conjunctivae and EOM are normal.  Neck: Normal range of motion. Neck supple. No thyromegaly present.  Cardiovascular: Normal rate, regular rhythm and normal heart sounds.  No murmur heard. Pulmonary/Chest: Effort normal and breath sounds normal. She has no wheezes. Right breast exhibits no mass and no skin change. Left breast exhibits no mass and no skin change.  Abdominal: Soft. Bowel sounds are  normal. She exhibits no mass.  Musculoskeletal: Normal range of motion.  Neurological: She is alert and oriented to person, place, and time. She has normal reflexes.  Skin: Skin is warm and dry.  Psychiatric: She has a normal mood and affect. Her behavior is normal. Judgment and thought content normal.  Vitals reviewed.   BP 110/74 (BP Location: Left Arm, Patient Position: Sitting, Cuff Size: Normal)   Pulse 76   Temp 98.1 F (36.7 C) (Oral)   Resp 16   Ht 5' 4" (1.626 m)   Wt 168 lb 12.8 oz (76.6 kg)   LMP 09/15/2017   SpO2 98%   BMI 28.97 kg/m   Results for orders placed or performed in visit on 10/11/17  POCT urinalysis dipstick  Result Value Ref Range   Color, UA yellow yellow   Clarity, UA clear clear   Glucose, UA negative negative mg/dL   Bilirubin, UA negative negative   Ketones, POC UA negative negative mg/dL   Spec Grav, UA 1.025 1.010 - 1.025   Blood, UA negative negative   pH, UA 6.0 5.0 - 8.0   Protein Ur, POC negative negative mg/dL   Urobilinogen, UA 0.2 0.2 or 1.0 E.U./dL   Nitrite, UA Negative Negative   Leukocytes, UA Negative Negative    Assessment and Plan: 1. Annual physical exam - Pt presents for annual exam. She is feeling well today, no complaints. Last pap 12/2016 which was egative. Sees GYN regularly. Significant FHx breast cancer, plans to get MM early. Routine labs are pending. Will contact with results. Health maintenance discussed.   2. Screening for endocrine, metabolic and immunity disorder - CMP14+EGFR - POCT urinalysis dipstick - CBC with Differential/Platelet - TSH  3. Screening, lipid - Lipid panel   Mercer Pod, PA-C  Primary Care at [place] 10/11/2017 8:41 AM

## 2017-10-12 LAB — CBC WITH DIFFERENTIAL/PLATELET
Basophils Absolute: 0 10*3/uL (ref 0.0–0.2)
Basos: 0 %
EOS (ABSOLUTE): 0.1 10*3/uL (ref 0.0–0.4)
Eos: 1 %
Hematocrit: 39.4 % (ref 34.0–46.6)
Hemoglobin: 12.9 g/dL (ref 11.1–15.9)
Immature Grans (Abs): 0 10*3/uL (ref 0.0–0.1)
Immature Granulocytes: 0 %
Lymphocytes Absolute: 1.5 10*3/uL (ref 0.7–3.1)
Lymphs: 24 %
MCH: 29.1 pg (ref 26.6–33.0)
MCHC: 32.7 g/dL (ref 31.5–35.7)
MCV: 89 fL (ref 79–97)
Monocytes Absolute: 0.5 10*3/uL (ref 0.1–0.9)
Monocytes: 8 %
Neutrophils Absolute: 4.1 10*3/uL (ref 1.4–7.0)
Neutrophils: 67 %
Platelets: 337 10*3/uL (ref 150–450)
RBC: 4.44 x10E6/uL (ref 3.77–5.28)
RDW: 13.8 % (ref 12.3–15.4)
WBC: 6.1 10*3/uL (ref 3.4–10.8)

## 2017-10-12 LAB — CMP14+EGFR
ALT: 10 IU/L (ref 0–32)
AST: 15 IU/L (ref 0–40)
Albumin/Globulin Ratio: 2.4 — ABNORMAL HIGH (ref 1.2–2.2)
Albumin: 4.6 g/dL (ref 3.5–5.5)
Alkaline Phosphatase: 59 IU/L (ref 39–117)
BUN/Creatinine Ratio: 24 — ABNORMAL HIGH (ref 9–23)
BUN: 17 mg/dL (ref 6–24)
Bilirubin Total: 0.5 mg/dL (ref 0.0–1.2)
CO2: 22 mmol/L (ref 20–29)
Calcium: 9.4 mg/dL (ref 8.7–10.2)
Chloride: 104 mmol/L (ref 96–106)
Creatinine, Ser: 0.7 mg/dL (ref 0.57–1.00)
GFR calc Af Amer: 125 mL/min/{1.73_m2} (ref >59)
GFR calc non Af Amer: 109 mL/min/{1.73_m2} (ref >59)
Globulin, Total: 1.9 g/dL (ref 1.5–4.5)
Glucose: 94 mg/dL (ref 65–99)
Potassium: 4.3 mmol/L (ref 3.5–5.2)
Sodium: 139 mmol/L (ref 134–144)
Total Protein: 6.5 g/dL (ref 6.0–8.5)

## 2017-10-12 LAB — LIPID PANEL
Chol/HDL Ratio: 3.2 ratio (ref 0.0–4.4)
Cholesterol, Total: 168 mg/dL (ref 100–199)
HDL: 52 mg/dL (ref >39)
LDL Calculated: 98 mg/dL (ref 0–99)
Triglycerides: 91 mg/dL (ref 0–149)
VLDL Cholesterol Cal: 18 mg/dL (ref 5–40)

## 2017-10-12 LAB — TSH: TSH: 2.7 u[IU]/mL (ref 0.450–4.500)

## 2017-10-21 ENCOUNTER — Encounter: Payer: Self-pay | Admitting: Physician Assistant

## 2017-11-29 IMAGING — DX DG CHEST 2V
2 series · 2 of 2 positions shown · non-contrast
Comparison: 02/12/2015

CLINICAL DATA: cough x 6 weeks

EXAM:
CHEST - 2 VIEW

[chest pa]
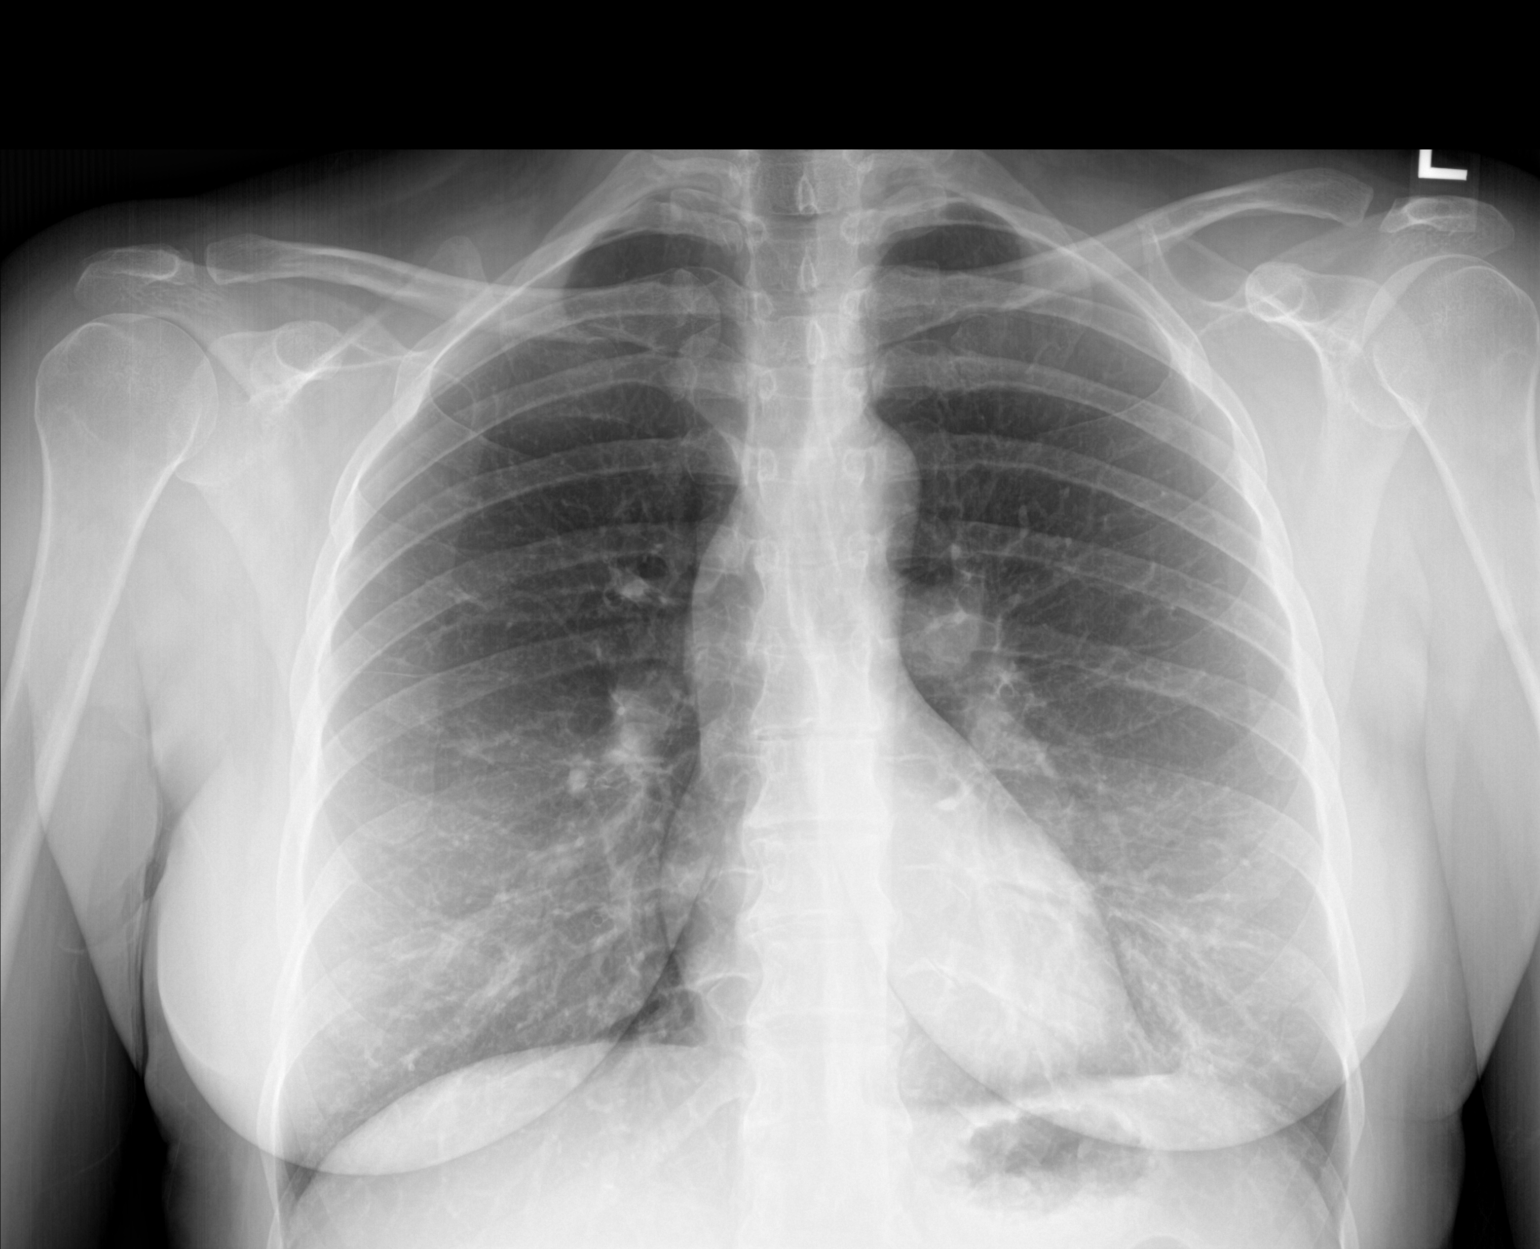

[chest lat]
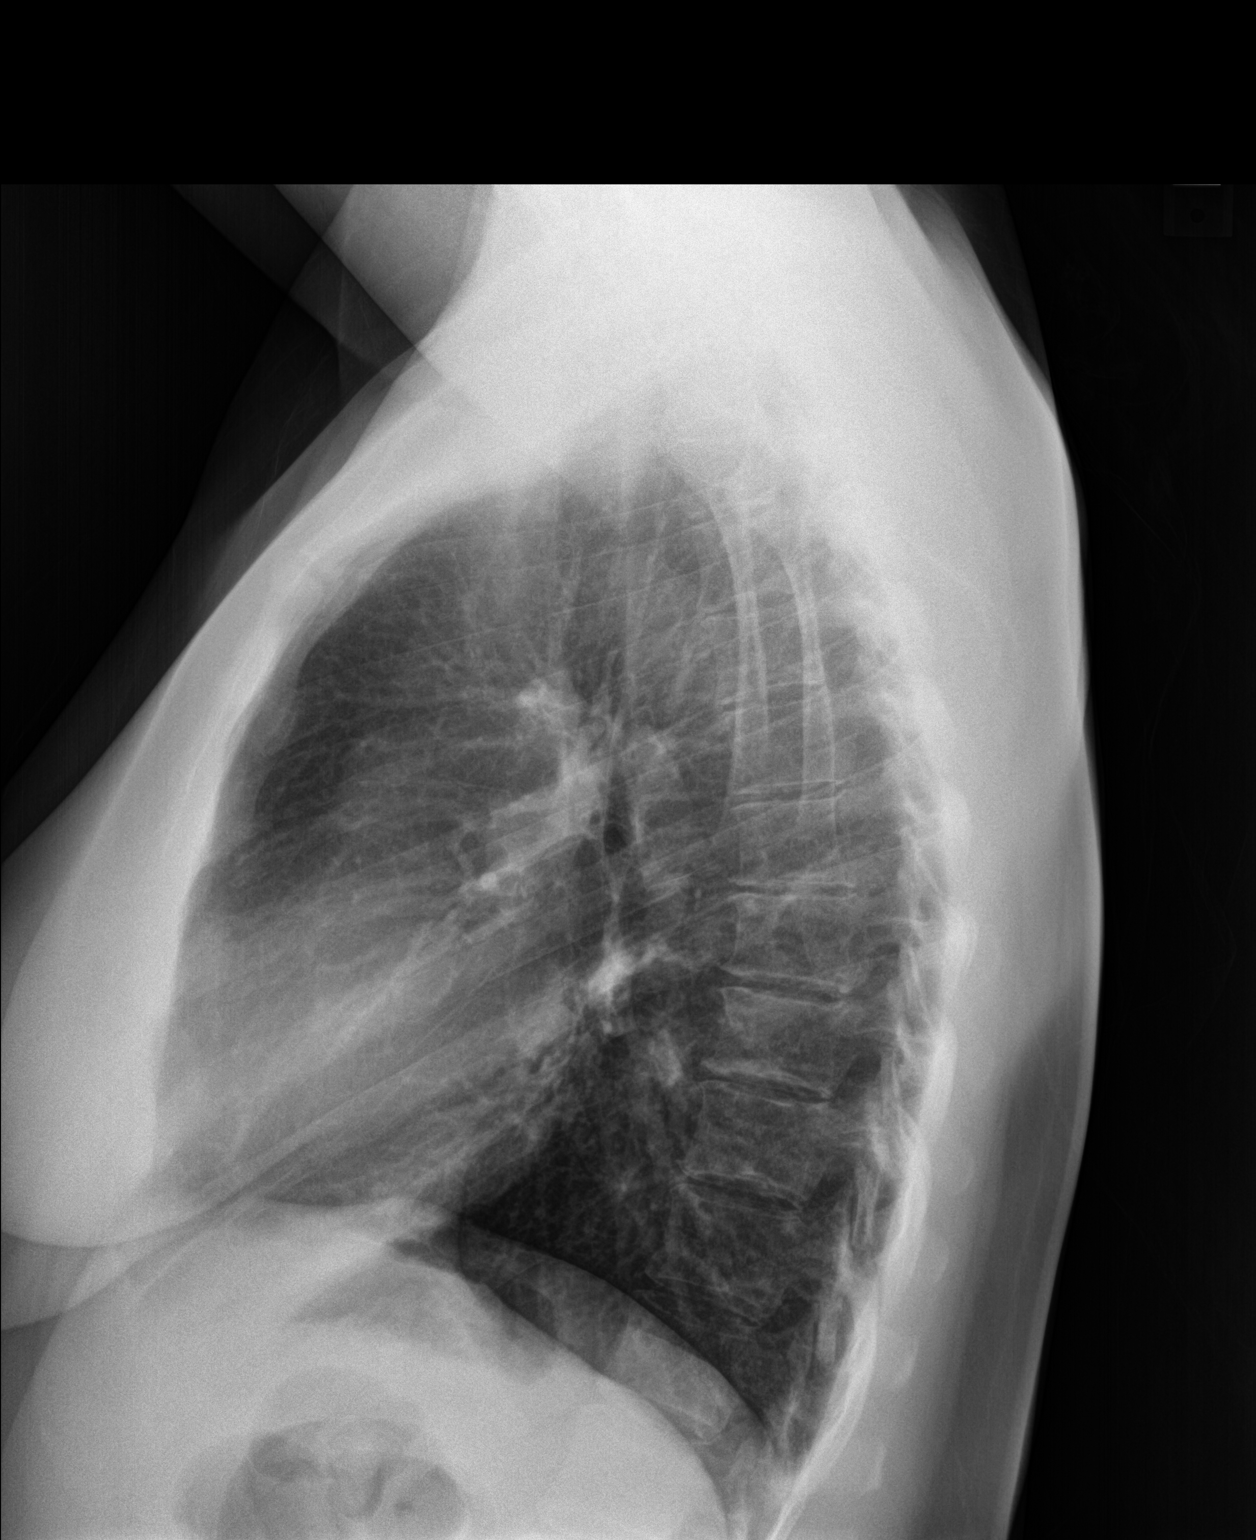

[2 of 2 positions shown; findings below may reference images not displayed]

FINDINGS: Focal infiltrate or subsegmental atelectasis in the anterior basal
segment left lower lobe. Right lung clear. Attenuated upper lobe
bronchovascular markings.

Heart size and mediastinal contours are within normal limits.

No effusion.

Visualized bones unremarkable.
IMPRESSION: 1. Anterior left lower lobe infiltrate or atelectasis.

## 2018-03-14 ENCOUNTER — Ambulatory Visit: Payer: Self-pay | Admitting: *Deleted

## 2018-03-14 NOTE — Telephone Encounter (Signed)
Pt reports cough x 1 week. States initially productive for thick yellowish phlegm, now "Dry, hacking cough." H/O seasonal allergies and exercise induced asthma. States afebrile, mild SOB with coughing spells. States some chest tightness at sternal area; using inhaler (albuterol) States "Sore from coughing." Denies wheezing, states is staying hydrated. TAking Nyquil at HS, effective. Also taking Allegra and Mucinex.Appt made with B. Barnett Abu for tomorrow. Instructed pt to go to UC if symptoms worsen. Reason for Disposition . [1] Continuous (nonstop) coughing interferes with work or school AND [2] no improvement using cough treatment per protocol  Answer Assessment - Initial Assessment Questions 1. ONSET: "When did the cough begin?"  One week ago      2. SEVERITY: "How bad is the cough today?"      Left work, "dry hacking cough." 3. RESPIRATORY DISTRESS: "Describe your breathing."      SOB with exertion 4. FEVER: "Do you have a fever?" If so, ask: "What is your temperature, how was it measured, and when did it start?"     no 5. HEMOPTYSIS: "Are you coughing up any blood?" If so ask: "How much?" (flecks, streaks, tablespoons, etc.)     no 6. TREATMENT: "What have you done so far to treat the cough?" (e.g., meds, fluids, humidifier)     Nyquil at HS, allergy meds, Allerga, mucinex 7. CARDIAC HISTORY: "Do you have any history of heart disease?" (e.g., heart attack, congestive heart failure)      no 8. LUNG HISTORY: "Do you have any history of lung disease?"  (e.g., pulmonary embolus, asthma, emphysema)    Asthma. Exercise induced, seasonal allergies. 9. PE RISK FACTORS: "Do you have a history of blood clots?" (or: recent major surgery, recent prolonged travel, bedridden)     no 10. OTHER SYMPTOMS: "Do you have any other symptoms? (e.g., runny nose, wheezing, chest pain)      Mild chest tightness, sternal area, using inhaler  Protocols used: COUGH - ACUTE NON-PRODUCTIVE-A-AH

## 2018-03-15 ENCOUNTER — Encounter: Payer: Self-pay | Admitting: Physician Assistant

## 2018-03-15 ENCOUNTER — Ambulatory Visit (INDEPENDENT_AMBULATORY_CARE_PROVIDER_SITE_OTHER): Payer: 59 | Admitting: Physician Assistant

## 2018-03-15 ENCOUNTER — Other Ambulatory Visit: Payer: Self-pay

## 2018-03-15 VITALS — BP 118/75 | HR 67 | Temp 98.2°F | Resp 20 | Ht 64.69 in | Wt 171.6 lb

## 2018-03-15 DIAGNOSIS — J209 Acute bronchitis, unspecified: Secondary | ICD-10-CM

## 2018-03-15 MED ORDER — HYDROCODONE-HOMATROPINE 5-1.5 MG/5ML PO SYRP: 5.0000 mL | ORAL_SOLUTION | Freq: Three times a day (TID) | ORAL | 0 refills | Status: DC | PRN

## 2018-03-15 MED ORDER — AZITHROMYCIN 250 MG PO TABS: ORAL_TABLET | ORAL | 0 refills | Status: DC

## 2018-03-15 MED ORDER — BENZONATATE 100 MG PO CAPS: 100.0000 mg | ORAL_CAPSULE | Freq: Three times a day (TID) | ORAL | 0 refills | Status: DC | PRN

## 2018-03-15 NOTE — Patient Instructions (Addendum)
- We will treat this as a respiratory viral infection.  - I recommend you rest, drink plenty of fluids, eat light meals including soups.  - You may use cough syrup at night for your cough and sore throat, Tessalon pearls during the day.  - Tea recipe for cough:  boil water, add 2 inches shaved ginger root, steep 15 minutes, add juice from 2 full lemons, and 2 tbsp honey. - If no improvement after 5-7 days, I have given you a print out for an antibiotic.  -Return to clinic if symptoms worsen, do not improve, or as needed      If you have lab work done today you will be contacted with your lab results within the next 2 weeks.  If you have not heard from us then please contact us. The fastest way to get your results is to register for My Chart.   Acute Bronchitis, Adult Acute bronchitis is when air tubes (bronchi) in the lungs suddenly get swollen. The condition can make it hard to breathe. It can also cause these symptoms:  A cough.  Coughing up clear, yellow, or green mucus.  Wheezing.  Chest congestion.  Shortness of breath.  A fever.  Body aches.  Chills.  A sore throat.  Follow these instructions at home: Medicines  Take over-the-counter and prescription medicines only as told by your doctor.  If you were prescribed an antibiotic medicine, take it as told by your doctor. Do not stop taking the antibiotic even if you start to feel better. General instructions  Rest.  Drink enough fluids to keep your pee (urine) clear or pale yellow.  Avoid smoking and secondhand smoke. If you smoke and you need help quitting, ask your doctor. Quitting will help your lungs heal faster.  Use an inhaler, cool mist vaporizer, or humidifier as told by your doctor.  Keep all follow-up visits as told by your doctor. This is important. How is this prevented? To lower your risk of getting this condition again:  Wash your hands often with soap and water. If you cannot use soap and  water, use hand sanitizer.  Avoid contact with people who have cold symptoms.  Try not to touch your hands to your mouth, nose, or eyes.  Make sure to get the flu shot every year.  Contact a doctor if:  Your symptoms do not get better in 2 weeks. Get help right away if:  You cough up blood.  You have chest pain.  You have very bad shortness of breath.  You become dehydrated.  You faint (pass out) or keep feeling like you are going to pass out.  You keep throwing up (vomiting).  You have a very bad headache.  Your fever or chills gets worse. This information is not intended to replace advice given to you by your health care provider. Make sure you discuss any questions you have with your health care provider. Document Released: 10/07/2007 Document Revised: 11/27/2015 Document Reviewed: 10/09/2015 Elsevier Interactive Patient Education  2018 [employer]  IF you received an x-ray today, you will receive an invoice from [facility]. Please contact [facility] at [phone] with questions or concerns regarding your invoice.   IF you received labwork today, you will receive an invoice from [facility]. Please contact [facility] at [phone] with questions or concerns regarding your invoice.   Our billing staff will not be able to assist you with questions regarding bills from these companies.  You will be contacted with the  lab results as soon as they are available. The fastest way to get your results is to activate your My Chart account. Instructions are located on the last page of this paperwork. If you have not heard from us regarding the results in 2 weeks, please contact this office.     

## 2018-03-15 NOTE — Progress Notes (Signed)
MRN: [id number] DOB: May 04, 1976  Subjective:   Mallory Williams C Cronkright is a 42 y.o. female presenting for chief complaint of Cough (X 1 week) .  Reports 1 week history of worsening dry hacking cough. Had little bit of sore throat, which resolved. Has tried mucinex with no full relief. Denies fever, sinus pain, rhinorrhea, ear pain, wheezing, shortness of breath, chest tightness, chest pain and myalgia, fatigue, nausea, vomiting, abdominal pain and diarrhea. No recent travel.  Sick exposure at work. Has not had any sick contact with anyone. No history of seasonal allergies. Has history of exercise induced asthma. Patient has had flu shot this season. No smoking. Has occasional alcohol use . Denies any other aggravating or relieving factors, no other questions or concerns.  Mallory Williams has a current medication list which includes the following prescription(s): albuterol. Also is allergic to amoxicillin.  Mallory Williams  has a past medical history of Allergy and Asthma. Also  has a past surgical history that includes Cesarean section.   Objective:   Vitals: BP 118/75   Pulse 67   Temp 98.2 F (36.8 C) (Oral)   Resp 20   Ht 5' 4.69" (1.643 m)   Wt 171 lb 9.6 oz (77.8 kg)   SpO2 97%   BMI 28.83 kg/m   Physical Exam  Constitutional: She is oriented to person, place, and time. She appears well-developed and well-nourished. No distress.  HENT:  Head: Normocephalic and atraumatic.  Right Ear: Tympanic membrane, external ear and ear canal normal.  Left Ear: Tympanic membrane, external ear and ear canal normal.  Nose: No mucosal edema. Right sinus exhibits no maxillary sinus tenderness and no frontal sinus tenderness. Left sinus exhibits no maxillary sinus tenderness and no frontal sinus tenderness.  Mouth/Throat: Uvula is midline and mucous membranes are normal. No posterior oropharyngeal edema, posterior oropharyngeal erythema or tonsillar abscesses. No tonsillar exudate.  Eyes: Conjunctivae are normal.   Neck: Normal range of motion.  Cardiovascular: Normal rate, regular rhythm, normal heart sounds and intact distal pulses.  Pulmonary/Chest: Effort normal and breath sounds normal. She has no decreased breath sounds. She has no wheezes. She has no rhonchi. She has no rales.  Lymphadenopathy:       Head (right side): No submental, no submandibular, no tonsillar, no preauricular, no posterior auricular and no occipital adenopathy present.       Head (left side): No submental, no submandibular, no tonsillar, no preauricular, no posterior auricular and no occipital adenopathy present.    She has no cervical adenopathy.       Right: No supraclavicular adenopathy present.       Left: No supraclavicular adenopathy present.  Neurological: She is alert and oriented to person, place, and time.  Skin: Skin is warm and dry.  Psychiatric: She has a normal mood and affect.  Vitals reviewed.   No results found for this or any previous visit (from the past 24 hour(s)).  Assessment and Plan :  1. Acute bronchitis, unspecified organism - Likely viral in etiology d/t reassuring physical exam findings.  - Advised supportive care, offered symptomatic relief. Given printed Rx for a zpack to have filled if not having improvement in 5-7 days.  - Return to clinic if symptoms worsen or fail to improve with tx, otherwise return to clinic as needed. - benzonatate (TESSALON) 100 MG capsule; Take 1-2 capsules (100-200 mg total) by mouth 3 (three) times daily as needed for cough.  Dispense: 40 capsule; Refill: 0 - HYDROcodone-homatropine (HYCODAN) 5-1.5  MG/5ML syrup; Take 5 mLs by mouth every 8 (eight) hours as needed for cough.  Dispense: 120 mL; Refill: 0 - azithromycin (ZITHROMAX) 250 MG tablet; Take 2 tabs PO x 1 dose, then 1 tab PO QD x 4 days  Dispense: 6 tablet; Refill: 0  Mallory Core, PA-C  [facility] at [facility] 03/15/2018 4:50 PM

## 2018-07-29 DIAGNOSIS — H5213 Myopia, bilateral: Secondary | ICD-10-CM | POA: Diagnosis not present

## 2019-06-14 DIAGNOSIS — Z1389 Encounter for screening for other disorder: Secondary | ICD-10-CM | POA: Diagnosis not present

## 2019-06-14 DIAGNOSIS — Z130 Encounter for screening for diseases of the blood and blood-forming organs and certain disorders involving the immune mechanism: Secondary | ICD-10-CM | POA: Diagnosis not present

## 2019-06-14 DIAGNOSIS — Z6828 Body mass index (BMI) 28.0-28.9, adult: Secondary | ICD-10-CM | POA: Diagnosis not present

## 2019-06-14 DIAGNOSIS — Z30431 Encounter for routine checking of intrauterine contraceptive device: Secondary | ICD-10-CM | POA: Diagnosis not present

## 2019-06-14 DIAGNOSIS — Z01419 Encounter for gynecological examination (general) (routine) without abnormal findings: Secondary | ICD-10-CM | POA: Diagnosis not present

## 2019-06-15 ENCOUNTER — Telehealth: Payer: Self-pay | Admitting: Physician Assistant

## 2019-06-15 NOTE — Telephone Encounter (Signed)
I called patient to see if she would still like to continue as our patient and try and schedule a TOC  Appointment   No answer

## 2019-06-19 DIAGNOSIS — T8332XA Displacement of intrauterine contraceptive device, initial encounter: Secondary | ICD-10-CM | POA: Diagnosis not present

## 2019-07-26 DIAGNOSIS — Z1231 Encounter for screening mammogram for malignant neoplasm of breast: Secondary | ICD-10-CM | POA: Diagnosis not present

## 2019-09-14 DIAGNOSIS — H5213 Myopia, bilateral: Secondary | ICD-10-CM | POA: Diagnosis not present

## 2019-09-18 DIAGNOSIS — L918 Other hypertrophic disorders of the skin: Secondary | ICD-10-CM | POA: Diagnosis not present

## 2020-04-06 ENCOUNTER — Ambulatory Visit: Payer: 59 | Attending: Internal Medicine

## 2020-04-06 DIAGNOSIS — Z23 Encounter for immunization: Secondary | ICD-10-CM

## 2020-04-06 NOTE — Progress Notes (Signed)
   Covid-19 Vaccination Clinic  Name:  LAJOY VANAMBURG    MRN: [id number] DOB: 1976-03-23  04/06/2020  Ms. Hur was observed post Covid-19 immunization for 15 minutes without incident. She was provided with Vaccine Information Sheet and instruction to access the V-Safe system.   Ms. Hoque was instructed to call 911 with any severe reactions post vaccine: Marland Kitchen Difficulty breathing  . Swelling of face and throat  . A fast heartbeat  . A bad rash all over body  . Dizziness and weakness   Immunizations Administered    No immunizations on file.

## 2020-09-26 DIAGNOSIS — H5213 Myopia, bilateral: Secondary | ICD-10-CM | POA: Diagnosis not present

## 2020-12-30 ENCOUNTER — Ambulatory Visit: Payer: 59 | Admitting: Emergency Medicine

## 2021-04-03 ENCOUNTER — Encounter: Payer: Self-pay | Admitting: Emergency Medicine

## 2021-04-03 ENCOUNTER — Other Ambulatory Visit: Payer: Self-pay

## 2021-04-03 ENCOUNTER — Other Ambulatory Visit (HOSPITAL_COMMUNITY): Payer: Self-pay

## 2021-04-03 ENCOUNTER — Ambulatory Visit (INDEPENDENT_AMBULATORY_CARE_PROVIDER_SITE_OTHER): Payer: 59 | Admitting: Emergency Medicine

## 2021-04-03 VITALS — BP 118/70 | HR 70 | Temp 97.9°F | Ht 64.0 in | Wt 180.0 lb

## 2021-04-03 DIAGNOSIS — Z1159 Encounter for screening for other viral diseases: Secondary | ICD-10-CM | POA: Diagnosis not present

## 2021-04-03 DIAGNOSIS — Z1329 Encounter for screening for other suspected endocrine disorder: Secondary | ICD-10-CM

## 2021-04-03 DIAGNOSIS — Z13220 Encounter for screening for lipoid disorders: Secondary | ICD-10-CM | POA: Diagnosis not present

## 2021-04-03 DIAGNOSIS — Z130 Encounter for screening for diseases of the blood and blood-forming organs and certain disorders involving the immune mechanism: Secondary | ICD-10-CM

## 2021-04-03 DIAGNOSIS — Z0000 Encounter for general adult medical examination without abnormal findings: Secondary | ICD-10-CM | POA: Diagnosis not present

## 2021-04-03 DIAGNOSIS — Z13228 Encounter for screening for other metabolic disorders: Secondary | ICD-10-CM | POA: Diagnosis not present

## 2021-04-03 DIAGNOSIS — Z114 Encounter for screening for human immunodeficiency virus [HIV]: Secondary | ICD-10-CM

## 2021-04-03 DIAGNOSIS — J45909 Unspecified asthma, uncomplicated: Secondary | ICD-10-CM | POA: Insufficient documentation

## 2021-04-03 DIAGNOSIS — J4530 Mild persistent asthma, uncomplicated: Secondary | ICD-10-CM | POA: Diagnosis not present

## 2021-04-03 LAB — CBC WITH DIFFERENTIAL/PLATELET
Basophils Absolute: 0 10*3/uL (ref 0.0–0.1)
Basophils Relative: 0.7 % (ref 0.0–3.0)
Eosinophils Absolute: 0.1 10*3/uL (ref 0.0–0.7)
Eosinophils Relative: 1.5 % (ref 0.0–5.0)
HCT: 39.1 % (ref 36.0–46.0)
Hemoglobin: 12.9 g/dL (ref 12.0–15.0)
Lymphocytes Relative: 25 % (ref 12.0–46.0)
Lymphs Abs: 1.4 10*3/uL (ref 0.7–4.0)
MCHC: 33 g/dL (ref 30.0–36.0)
MCV: 91.2 fl (ref 78.0–100.0)
Monocytes Absolute: 0.4 10*3/uL (ref 0.1–1.0)
Monocytes Relative: 8.2 % (ref 3.0–12.0)
Neutro Abs: 3.5 10*3/uL (ref 1.4–7.7)
Neutrophils Relative %: 64.6 % (ref 43.0–77.0)
Platelets: 325 10*3/uL (ref 150.0–400.0)
RBC: 4.28 Mil/uL (ref 3.87–5.11)
RDW: 13.3 % (ref 11.5–15.5)
WBC: 5.5 10*3/uL (ref 4.0–10.5)

## 2021-04-03 LAB — COMPREHENSIVE METABOLIC PANEL
ALT: 15 U/L (ref 0–35)
AST: 15 U/L (ref 0–37)
Albumin: 4.3 g/dL (ref 3.5–5.2)
Alkaline Phosphatase: 63 U/L (ref 39–117)
BUN: 19 mg/dL (ref 6–23)
CO2: 28 mEq/L (ref 19–32)
Calcium: 9.4 mg/dL (ref 8.4–10.5)
Chloride: 106 mEq/L (ref 96–112)
Creatinine, Ser: 0.73 mg/dL (ref 0.40–1.20)
GFR: 100.4 mL/min (ref >60.00)
Glucose, Bld: 81 mg/dL (ref 70–99)
Potassium: 4.1 mEq/L (ref 3.5–5.1)
Sodium: 139 mEq/L (ref 135–145)
Total Bilirubin: 0.4 mg/dL (ref 0.2–1.2)
Total Protein: 6.8 g/dL (ref 6.0–8.3)

## 2021-04-03 LAB — HEMOGLOBIN A1C: Hgb A1c MFr Bld: 5.4 % (ref 4.6–6.5)

## 2021-04-03 LAB — LIPID PANEL
Cholesterol: 210 mg/dL — ABNORMAL HIGH (ref 0–200)
HDL: 62.6 mg/dL (ref >39.00)
LDL Cholesterol: 116 mg/dL — ABNORMAL HIGH (ref 0–99)
NonHDL: 146.94
Total CHOL/HDL Ratio: 3
Triglycerides: 153 mg/dL — ABNORMAL HIGH (ref 0.0–149.0)
VLDL: 30.6 mg/dL (ref 0.0–40.0)

## 2021-04-03 LAB — HIV ANTIBODY (ROUTINE TESTING W REFLEX): HIV 1&2 Ab, 4th Generation: NONREACTIVE

## 2021-04-03 LAB — HEPATITIS C ANTIBODY
Hepatitis C Ab: NONREACTIVE
SIGNAL TO CUT-OFF: 0.02 (ref <1.00)

## 2021-04-03 MED ORDER — BUDESONIDE-FORMOTEROL FUMARATE 80-4.5 MCG/ACT IN AERO
2.0000 | INHALATION_SPRAY | Freq: Two times a day (BID) | RESPIRATORY_TRACT | 3 refills | Status: DC
  Filled 2021-04-03: qty 10.2, 30d supply, fill #0
  Filled 2021-05-30: qty 10.2, 30d supply, fill #1
  Filled 2021-09-26 – 2021-10-15 (×3): qty 10.2, 30d supply, fill #2

## 2021-04-03 NOTE — Progress Notes (Signed)
Andrew Au 45 y.o.   Chief Complaint  Patient presents with   Cough    X 1 month   New Patient (Initial Visit)    HISTORY OF PRESENT ILLNESS: This is a 45 y.o. female here for annual exam. Also complaining of persistent dry cough since having the flu late last October. No other associated symptoms.  Has remote history of asthma but has not been active.  No recent use of albuterol inhaler. No other complaints or medical concerns today. Sees gynecologist on a regular basis.  Up-to-date with cervical cancer screening. Non-smoker.  Healthy female with a healthy lifestyle.  Cough Pertinent negatives include no chest pain, chills, fever, headaches, hemoptysis, rash, sore throat, shortness of breath or wheezing.    Prior to Admission medications   Medication Sig Start Date End Date Taking? Authorizing Provider  budesonide-formoterol (SYMBICORT) 80-4.5 MCG/ACT inhaler Inhale 2 puffs into the lungs 2 (two) times daily. 04/03/21  Yes Ruslan Mccabe, Ines Bloomer, MD  albuterol (PROVENTIL HFA;VENTOLIN HFA) 108 (90 Base) MCG/ACT inhaler Inhale 1-2 puffs into the lungs every 6 (six) hours as needed for wheezing. Patient not taking: Reported on 04/03/2021 07/09/17   McVey, Gelene Mink, PA-C    Allergies  Allergen Reactions   Amoxicillin Rash    Patient Active Problem List   Diagnosis Date Noted   Asthma, exercise induced 04/29/2015    Past Medical History:  Diagnosis Date   Allergy    Asthma     Past Surgical History:  Procedure Laterality Date   CESAREAN SECTION      Social History   Socioeconomic History   Marital status: Married    Spouse name: Not on file   Number of children: 3   Years of education: Not on file   Highest education level: Not on file  Occupational History   Not on file  Tobacco Use   Smoking status: Never   Smokeless tobacco: Never  Vaping Use   Vaping Use: Never used  Substance and Sexual Activity   Alcohol use: Yes    Alcohol/week: 3.0 - 4.0  standard drinks    Types: 3 - 4 Standard drinks or equivalent per week   Drug use: No   Sexual activity: Yes    Partners: Male  Other Topics Concern   Not on file  Social History Narrative   Not on file   Social Determinants of Health   Financial Resource Strain: Not on file  Food Insecurity: Not on file  Transportation Needs: Not on file  Physical Activity: Not on file  Stress: Not on file  Social Connections: Not on file  Intimate Partner Violence: Not on file    Family History  Problem Relation Age of Onset   Breast cancer Maternal Grandmother    Stroke Maternal Grandmother    Heart attack Maternal Grandfather    Hypertension Brother      Review of Systems  Constitutional: Negative.  Negative for chills and fever.  HENT: Negative.  Negative for congestion and sore throat.   Respiratory:  Positive for cough. Negative for hemoptysis, sputum production, shortness of breath and wheezing.   Cardiovascular: Negative.  Negative for chest pain and palpitations.  Gastrointestinal: Negative.  Negative for abdominal pain, blood in stool, diarrhea, melena, nausea and vomiting.  Genitourinary: Negative.  Negative for dysuria and hematuria.  Skin: Negative.  Negative for rash.  Neurological: Negative.  Negative for dizziness and headaches.  All other systems reviewed and are negative.   Physical Exam  Vitals reviewed.  Constitutional:      Appearance: Normal appearance.  HENT:     Head: Normocephalic.     Right Ear: Tympanic membrane, ear canal and external ear normal.     Left Ear: Tympanic membrane, ear canal and external ear normal.     Mouth/Throat:     Mouth: Mucous membranes are moist.     Pharynx: Oropharynx is clear.  Eyes:     Extraocular Movements: Extraocular movements intact.     Conjunctiva/sclera: Conjunctivae normal.     Pupils: Pupils are equal, round, and reactive to light.  Cardiovascular:     Rate and Rhythm: Normal rate and regular rhythm.      Pulses: Normal pulses.     Heart sounds: Normal heart sounds.  Pulmonary:     Effort: Pulmonary effort is normal.     Breath sounds: Normal breath sounds.  Abdominal:     General: There is no distension.     Palpations: Abdomen is soft.     Tenderness: There is no abdominal tenderness.  Musculoskeletal:        General: Normal range of motion.     Cervical back: Normal range of motion and neck supple.     Right lower leg: No edema.     Left lower leg: No edema.  Skin:    General: Skin is warm and dry.     Capillary Refill: Capillary refill takes less than 2 seconds.  Neurological:     General: No focal deficit present.     Mental Status: She is alert and oriented to person, place, and time.  Psychiatric:        Mood and Affect: Mood normal.        Behavior: Behavior normal.     ASSESSMENT & PLAN: Problem List Items Addressed This Visit   None Visit Diagnoses     Routine general medical examination at a health care facility    -  Primary   Mild persistent reactive airway disease without complication       Need for hepatitis C screening test       Relevant Orders   Hepatitis C antibody screen   Screening for HIV (human immunodeficiency virus)       Relevant Orders   HIV antibody   Screening for deficiency anemia       Relevant Orders   CBC with Differential   Screening for lipoid disorders       Relevant Orders   Lipid panel   Screening for endocrine, metabolic and immunity disorder       Relevant Orders   Comprehensive metabolic panel   Hemoglobin A1c      Modifiable risk factors discussed with patient. Anticipatory guidance according to age provided. The following topics were also discussed: Social Determinants of Health Smoking.  Non-smoker Diet and nutrition Benefits of exercise Cancer family history Vaccinations recommendations Cardiovascular risk assessment Presence of persistent cough post recent influenza infection. Related to asthma and reactive  airway disease.  May benefit from short-term use of Symbicort twice a day. Mental health including depression and anxiety Fall and accident prevention  Patient Instructions  Health Maintenance, Female Adopting a healthy lifestyle and getting preventive care are important in promoting health and wellness. Ask your health care provider about: The right schedule for you to have regular tests and exams. Things you can do on your own to prevent diseases and keep yourself healthy. What should I know about diet, weight, and exercise? Eat  a healthy diet  Eat a diet that includes plenty of vegetables, fruits, low-fat dairy products, and lean protein. Do not eat a lot of foods that are high in solid fats, added sugars, or sodium. Maintain a healthy weight Body mass index (BMI) is used to identify weight problems. It estimates body fat based on height and weight. Your health care provider can help determine your BMI and help you achieve or maintain a healthy weight. Get regular exercise Get regular exercise. This is one of the most important things you can do for your health. Most adults should: Exercise for at least 150 minutes each week. The exercise should increase your heart rate and make you sweat (moderate-intensity exercise). Do strengthening exercises at least twice a week. This is in addition to the moderate-intensity exercise. Spend less time sitting. Even light physical activity can be beneficial. Watch cholesterol and blood lipids Have your blood tested for lipids and cholesterol at 43 years of age, then have this test every 5 years. Have your cholesterol levels checked more often if: Your lipid or cholesterol levels are high. You are older than 43 years of age. You are at high risk for heart disease. What should I know about cancer screening? Depending on your health history and family history, you may need to have cancer screening at various ages. This may include screening  for: Breast cancer. Cervical cancer. Colorectal cancer. Skin cancer. Lung cancer. What should I know about heart disease, diabetes, and high blood pressure? Blood pressure and heart disease High blood pressure causes heart disease and increases the risk of stroke. This is more likely to develop in people who have high blood pressure readings or are overweight. Have your blood pressure checked: Every 3-5 years if you are 50-66 years of age. Every year if you are 68 years old or older. Diabetes Have regular diabetes screenings. This checks your fasting blood sugar level. Have the screening done: Once every three years after age 71 if you are at a normal weight and have a low risk for diabetes. More often and at a younger age if you are overweight or have a high risk for diabetes. What should I know about preventing infection? Hepatitis B If you have a higher risk for hepatitis B, you should be screened for this virus. Talk with your health care provider to find out if you are at risk for hepatitis B infection. Hepatitis C Testing is recommended for: Everyone born from [birth year] through 1963. Anyone with known risk factors for hepatitis C. Sexually transmitted infections (STIs) Get screened for STIs, including gonorrhea and chlamydia, if: You are sexually active and are younger than 43 years of age. You are older than 43 years of age and your health care provider tells you that you are at risk for this type of infection. Your sexual activity has changed since you were last screened, and you are at increased risk for chlamydia or gonorrhea. Ask your health care provider if you are at risk. Ask your health care provider about whether you are at high risk for HIV. Your health care provider may recommend a prescription medicine to help prevent HIV infection. If you choose to take medicine to prevent HIV, you should first get tested for HIV. You should then be tested every 3 months for as long as you  are taking the medicine. Pregnancy If you are about to stop having your period (premenopausal) and you may become pregnant, seek counseling before you get pregnant. Take 400  to 800 micrograms (mcg) of folic acid every day if you become pregnant. Ask for birth control (contraception) if you want to prevent pregnancy. Osteoporosis and menopause Osteoporosis is a disease in which the bones lose minerals and strength with aging. This can result in bone fractures. If you are 21 years old or older, or if you are at risk for osteoporosis and fractures, ask your health care provider if you should: Be screened for bone loss. Take a calcium or vitamin D supplement to lower your risk of fractures. Be given hormone replacement therapy (HRT) to treat symptoms of menopause. Follow these instructions at home: Alcohol use Do not drink alcohol if: Your health care provider tells you not to drink. You are pregnant, may be pregnant, or are planning to become pregnant. If you drink alcohol: Limit how much you have to: 0-1 drink a day. Know how much alcohol is in your drink. In the [place] one drink equals one 12 oz bottle of beer (355 mL), one 5 oz glass of wine (148 mL), or one 1 oz glass of hard liquor (44 mL). Lifestyle Do not use any products that contain nicotine or tobacco. These products include cigarettes, chewing tobacco, and vaping devices, such as e-cigarettes. If you need help quitting, ask your health care provider. Do not use street drugs. Do not share needles. Ask your health care provider for help if you need support or information about quitting drugs. General instructions Schedule regular health, dental, and eye exams. Stay current with your vaccines. Tell your health care provider if: You often feel depressed. You have ever been abused or do not feel safe at home. Summary Adopting a healthy lifestyle and getting preventive care are important in promoting health and wellness. Follow your  health care provider's instructions about healthy diet, exercising, and getting tested or screened for diseases. Follow your health care provider's instructions on monitoring your cholesterol and blood pressure. This information is not intended to replace advice given to you by your health care provider. Make sure you discuss any questions you have with your health care provider. Document Revised: 09/09/2020 Document Reviewed: 09/09/2020 Elsevier Patient Education  2022 [place], MD [place] [facility]

## 2021-04-03 NOTE — Patient Instructions (Signed)

## 2021-04-04 ENCOUNTER — Other Ambulatory Visit (HOSPITAL_COMMUNITY): Payer: Self-pay

## 2021-04-16 ENCOUNTER — Other Ambulatory Visit: Payer: Self-pay

## 2021-05-30 ENCOUNTER — Other Ambulatory Visit (HOSPITAL_COMMUNITY): Payer: Self-pay

## 2021-08-11 DIAGNOSIS — H5213 Myopia, bilateral: Secondary | ICD-10-CM | POA: Diagnosis not present

## 2021-09-26 ENCOUNTER — Other Ambulatory Visit (HOSPITAL_COMMUNITY): Payer: Self-pay

## 2021-10-08 ENCOUNTER — Other Ambulatory Visit (HOSPITAL_COMMUNITY): Payer: Self-pay

## 2021-10-15 ENCOUNTER — Other Ambulatory Visit (HOSPITAL_COMMUNITY): Payer: Self-pay

## 2021-12-11 ENCOUNTER — Ambulatory Visit: Payer: 59 | Admitting: Internal Medicine

## 2021-12-11 ENCOUNTER — Other Ambulatory Visit (HOSPITAL_COMMUNITY): Payer: Self-pay

## 2021-12-11 ENCOUNTER — Ambulatory Visit (INDEPENDENT_AMBULATORY_CARE_PROVIDER_SITE_OTHER): Payer: 59

## 2021-12-11 ENCOUNTER — Encounter: Payer: Self-pay | Admitting: Internal Medicine

## 2021-12-11 VITALS — BP 112/82 | HR 69 | Temp 98.2°F | Ht 64.0 in | Wt 180.0 lb

## 2021-12-11 DIAGNOSIS — R059 Cough, unspecified: Secondary | ICD-10-CM | POA: Diagnosis not present

## 2021-12-11 DIAGNOSIS — J4530 Mild persistent asthma, uncomplicated: Secondary | ICD-10-CM | POA: Diagnosis not present

## 2021-12-11 DIAGNOSIS — R053 Chronic cough: Secondary | ICD-10-CM | POA: Diagnosis not present

## 2021-12-11 MED ORDER — TRELEGY ELLIPTA 200-62.5-25 MCG/ACT IN AEPB
1.0000 | INHALATION_SPRAY | Freq: Every day | RESPIRATORY_TRACT | 1 refills | Status: AC
  Filled 2021-12-11 (×2): qty 60, 30d supply, fill #0
  Filled 2022-01-08: qty 60, 30d supply, fill #1
  Filled 2022-06-08: qty 60, 30d supply, fill #2

## 2021-12-11 MED ORDER — MONTELUKAST SODIUM 10 MG PO TABS
10.0000 mg | ORAL_TABLET | Freq: Every day | ORAL | 1 refills | Status: AC
  Filled 2021-12-11: qty 90, 90d supply, fill #0

## 2021-12-11 NOTE — Patient Instructions (Signed)
Asthma, Adult ? ?Asthma is a long-term (chronic) condition that causes recurrent episodes in which the lower airways in the lungs become tight and narrow. The narrowing is caused by inflammation and tightening of the smooth muscle around the lower airways. ?Asthma episodes, also called asthma attacks or asthma flares, may cause coughing, making high-pitched whistling sounds when you breathe, most often when you breathe out (wheezing), shortness of breath, and chest pain. The airways may produce extra mucus caused by the inflammation and irritation. During an attack, it can be difficult to breathe. Asthma attacks can range from minor to life-threatening. ?Asthma cannot be cured, but medicines and lifestyle changes can help control it and treat acute attacks. It is important to keep your asthma well controlled so the condition does not interfere with your daily life. ?What are the causes? ?This condition is believed to be caused by inherited (genetic) and environmental factors, but its exact cause is not known. ?What can trigger an asthma attack? ?Many things can bring on an asthma attack or make symptoms worse. These triggers are different for every person. Common triggers include: ?Allergens and irritants like mold, dust, pet dander, cockroaches, pollen, air pollution, and chemical odors. ?Cigarette smoke. ?Weather changes and cold air. ?Stress and strong emotional responses such as crying or laughing hard. ?Certain medications such as aspirin or beta blockers. ?Infections and inflammatory conditions, such as the flu, a cold, pneumonia, or inflammation of the nasal membranes (rhinitis). ?Gastroesophageal reflux disease (GERD). ?What are the signs or symptoms? ?Symptoms may occur right after exposure to an asthma trigger or hours later and can vary by person. Common signs and symptoms include: ?Wheezing. ?Trouble breathing (shortness of breath). ?Excessive nighttime or early morning coughing. ?Chest  tightness. ?Tiredness (fatigue) with minimal activity. ?Difficulty talking in complete sentences. ?Poor exercise tolerance. ?How is this diagnosed? ?This condition is diagnosed based on: ?A physical exam and your medical history. ?Tests, which may include: ?Lung function studies to evaluate the flow of air in your lungs. ?Allergy tests. ?Imaging tests, such as X-rays. ?How is this treated? ?There is no cure, but symptoms can be controlled with proper treatment. Treatment usually involves: ?Identifying and avoiding your asthma triggers. ?Inhaled medicines. Two types are commonly used to treat asthma, depending on severity: ?Controller medicines. These help prevent asthma symptoms from occurring. They are taken every day. ?Fast-acting reliever or rescue medicines. These quickly relieve asthma symptoms. They are used as needed and provide short-term relief. ?Using other medicines, such as: ?Allergy medicines, such as antihistamines, if your asthma attacks are triggered by allergens. ?Immune medicines (immunomodulators). These are medicines that help control the immune system. ?Using supplemental oxygen. This is only needed during a severe episode. ?Creating an asthma action plan. An asthma action plan is a written plan for managing and treating your asthma attacks. This plan includes: ?A list of your asthma triggers and how to avoid them. ?Information about when medicines should be taken and when their dosage should be changed. ?Instructions about using a device called a peak flow meter. A peak flow meter measures how well the lungs are working and the severity of your asthma. It helps you monitor your condition. ?Follow these instructions at home: ?Take over-the-counter and prescription medicines only as told by your health care provider. ?Stay up to date on all vaccinations as recommended by your healthcare provider, including vaccines for the flu and pneumonia. ?Use a peak flow meter and keep track of your peak flow  readings. ?Understand and use your asthma   action plan to address any asthma flares. ?Do not smoke or allow anyone to smoke in your home. ?Contact a health care provider if: ?You have wheezing, shortness of breath, or a cough that is not responding to medicines. ?Your medicines are causing side effects, such as a rash, itching, swelling, or trouble breathing. ?You need to use a reliever medicine more than 2-3 times a week. ?Your peak flow reading is still at 50-79% of your personal best after following your action plan for 1 hour. ?You have a fever and shortness of breath. ?Get help right away if: ?You are getting worse and do not respond to treatment during an asthma attack. ?You are short of breath when at rest or when doing very little physical activity. ?You have difficulty eating, drinking, or talking. ?You have chest pain or tightness. ?You develop a fast heartbeat or palpitations. ?You have a bluish color to your lips or fingernails. ?You are light-headed or dizzy, or you faint. ?Your peak flow reading is less than 50% of your personal best. ?You feel too tired to breathe normally. ?These symptoms may be an emergency. Get help right away. Call 911. ?Do not wait to see if the symptoms will go away. ?Do not drive yourself to the hospital. ?Summary ?Asthma is a long-term (chronic) condition that causes recurrent episodes in which the airways become tight and narrow. Asthma episodes, also called asthma attacks or asthma flares, can cause coughing, wheezing, shortness of breath, and chest pain. ?Asthma cannot be cured, but medicines and lifestyle changes can help keep it well controlled and prevent asthma flares. ?Make sure you understand how to avoid triggers and how and when to use your medicines. ?Asthma attacks can range from minor to life-threatening. Get help right away if you have an asthma attack and do not respond to treatment with your usual rescue medicines. ?This information is not intended to replace  advice given to you by your health care provider. Make sure you discuss any questions you have with your health care provider. ?Document Revised: 02/05/2021 Document Reviewed: 01/27/2021 ?Elsevier Patient Education ? 2023 [employer] ? ?

## 2021-12-11 NOTE — Progress Notes (Signed)
Subjective:  Patient ID: Mallory Williams, female    DOB: [birth year]  Age: 46 y.o. MRN: [id number]  CC: Cough and Asthma   HPI Mallory Williams presents for f/up -  She complains of a 58-month history of nonproductive cough, wheezing, and chest tightness.  She has been increasing the use of her rescue and maintenance inhaler.  She denies night sweats, fever, chills, hemoptysis, or weight loss.  Outpatient Medications Prior to Visit  Medication Sig Dispense Refill   budesonide-formoterol (SYMBICORT) 80-4.5 MCG/ACT inhaler Inhale 2 puffs into the lungs 2 (two) times daily. 10.2 g 3   albuterol (PROVENTIL HFA;VENTOLIN HFA) 108 (90 Base) MCG/ACT inhaler Inhale 1-2 puffs into the lungs every 6 (six) hours as needed for wheezing. (Patient not taking: Reported on 12/11/2021) 1 Inhaler 0   No facility-administered medications prior to visit.    ROS Review of Systems  Constitutional: Negative.  Negative for chills, diaphoresis, fatigue and fever.  HENT: Negative.    Respiratory:  Positive for cough and chest tightness. Negative for shortness of breath and wheezing.   Cardiovascular:  Negative for chest pain, palpitations and leg swelling.  Gastrointestinal:  Negative for abdominal pain, diarrhea and nausea.  Endocrine: Negative.   Genitourinary: Negative.  Negative for difficulty urinating.  Musculoskeletal: Negative.   Skin: Negative.   Neurological: Negative.   Hematological:  Negative for adenopathy. Does not bruise/bleed easily.  Psychiatric/Behavioral: Negative.      Objective:  BP 112/82 (BP Location: Right Arm, Patient Position: Sitting, Cuff Size: Large)   Pulse 69   Temp 98.2 F (36.8 C) (Oral)   Ht 5\' 4"  (1.626 m)   Wt 180 lb (81.6 kg)   SpO2 98%   BMI 30.90 kg/m   BP Readings from Last 3 Encounters:  12/11/21 112/82  04/03/21 118/70  03/15/18 118/75    Wt Readings from Last 3 Encounters:  12/11/21 180 lb (81.6 kg)  04/03/21 180 lb (81.6 kg)  03/15/18 171 lb 9.6 oz  (77.8 kg)    Physical Exam Vitals reviewed.  HENT:     Nose: Nose normal.     Mouth/Throat:     Mouth: Mucous membranes are moist.  Eyes:     General: No scleral icterus.    Conjunctiva/sclera: Conjunctivae normal.  Cardiovascular:     Rate and Rhythm: Normal rate and regular rhythm.     Heart sounds: No murmur heard. Pulmonary:     Effort: Pulmonary effort is normal.     Breath sounds: No stridor. No wheezing, rhonchi or rales.  Abdominal:     General: Abdomen is flat.     Palpations: There is no mass.     Tenderness: There is no abdominal tenderness. There is no guarding.     Hernia: No hernia is present.  Musculoskeletal:        General: Normal range of motion.     Cervical back: Neck supple.     Right lower leg: No edema.     Left lower leg: No edema.  Lymphadenopathy:     Cervical: No cervical adenopathy.  Skin:    General: Skin is warm and dry.  Neurological:     General: No focal deficit present.     Mental Status: She is alert.  Psychiatric:        Mood and Affect: Mood normal.        Behavior: Behavior normal.     Lab Results  Component Value Date   WBC 5.5 04/03/2021  HGB 12.9 04/03/2021   HCT 39.1 04/03/2021   PLT 325.0 04/03/2021   GLUCOSE 81 04/03/2021   CHOL 210 (H) 04/03/2021   TRIG 153.0 (H) 04/03/2021   HDL 62.60 04/03/2021   LDLCALC 116 (H) 04/03/2021   ALT 15 04/03/2021   AST 15 04/03/2021   NA 139 04/03/2021   K 4.1 04/03/2021   CL 106 04/03/2021   CREATININE 0.73 04/03/2021   BUN 19 04/03/2021   CO2 28 04/03/2021   TSH 2.700 10/11/2017   HGBA1C 5.4 04/03/2021    DG Chest 2 View  Result Date: 12/11/2021 CLINICAL DATA:  Cough EXAM: CHEST - 2 VIEW COMPARISON:  Chest two-view 04/20/2016 FINDINGS: The heart size and mediastinal contours are within normal limits. Both lungs are clear. The visualized skeletal structures are unremarkable. IMPRESSION: No active cardiopulmonary disease. Electronically Signed   By: Marlan Palau M.D.   On:  12/11/2021 09:27     Assessment & Plan:   Mallory Williams was seen today for cough and asthma.  Diagnoses and all orders for this visit:  Chronic cough- Chest x-ray is negative for mass or infiltrate. -     DG Chest 2 View; Future  Mild persistent asthma without complication- Will improve treatment by increasing the dose of the ICS and adding a LAMA.  Will also add a leukotriene inhibitor. -     Fluticasone-Umeclidin-Vilant (TRELEGY ELLIPTA) 200-62.5-25 MCG/ACT AEPB; Inhale 1 puff into the lungs daily. -     montelukast (SINGULAIR) 10 MG tablet; Take 1 tablet (10 mg total) by mouth at bedtime.   I have discontinued Mallory Williams's budesonide-formoterol. I am also having her start on Trelegy Ellipta and montelukast. Additionally, I am having her maintain her albuterol.  Meds ordered this encounter  Medications   Fluticasone-Umeclidin-Vilant (TRELEGY ELLIPTA) 200-62.5-25 MCG/ACT AEPB    Sig: Inhale 1 puff into the lungs daily.    Dispense:  120 each    Refill:  1   montelukast (SINGULAIR) 10 MG tablet    Sig: Take 1 tablet (10 mg total) by mouth at bedtime.    Dispense:  100 tablet    Refill:  1     Follow-up: Return in about 3 months (around 03/13/2022).  Sanda Linger, MD

## 2021-12-12 ENCOUNTER — Other Ambulatory Visit (HOSPITAL_COMMUNITY): Payer: Self-pay

## 2022-01-09 ENCOUNTER — Other Ambulatory Visit (HOSPITAL_COMMUNITY): Payer: Self-pay

## 2022-01-12 ENCOUNTER — Other Ambulatory Visit (HOSPITAL_COMMUNITY): Payer: Self-pay

## 2022-06-08 ENCOUNTER — Other Ambulatory Visit (HOSPITAL_COMMUNITY): Payer: Self-pay

## 2022-10-23 ENCOUNTER — Other Ambulatory Visit (HOSPITAL_COMMUNITY): Payer: Self-pay
